# Patient Record
Sex: Female | Born: 1980
Health system: Southern US, Community
[De-identification: ages and names within clinical notes are randomized; demographics above are authoritative.]

## PROBLEM LIST (undated history)

## (undated) DIAGNOSIS — G600 Hereditary motor and sensory neuropathy: Secondary | ICD-10-CM

## (undated) DIAGNOSIS — G7100 Muscular dystrophy, unspecified: Secondary | ICD-10-CM

## (undated) DIAGNOSIS — G43909 Migraine, unspecified, not intractable, without status migrainosus: Secondary | ICD-10-CM

## (undated) HISTORY — DX: Muscular dystrophy, unspecified: G71.00

## (undated) HISTORY — PX: ABDOMINAL HYSTERECTOMY: SHX81

## (undated) HISTORY — PX: TUBAL LIGATION: SHX77

## (undated) HISTORY — DX: Migraine, unspecified, not intractable, without status migrainosus: G43.909

## (undated) HISTORY — DX: Hereditary motor and sensory neuropathy: G60.0

## (undated) HISTORY — PX: OTHER SURGICAL HISTORY: SHX169

---

## 2002-04-16 ENCOUNTER — Other Ambulatory Visit: Admission: RE | Admit: 2002-04-16 | Discharge: 2002-04-16 | Payer: Self-pay | Admitting: *Deleted

## 2003-04-03 ENCOUNTER — Other Ambulatory Visit: Admission: RE | Admit: 2003-04-03 | Discharge: 2003-04-03 | Payer: Self-pay | Admitting: Obstetrics and Gynecology

## 2007-04-04 ENCOUNTER — Emergency Department: Payer: Self-pay | Admitting: Emergency Medicine

## 2009-05-24 ENCOUNTER — Emergency Department: Payer: Self-pay | Admitting: Emergency Medicine

## 2009-05-27 ENCOUNTER — Ambulatory Visit: Payer: Self-pay

## 2011-08-03 ENCOUNTER — Ambulatory Visit: Payer: Self-pay | Admitting: Obstetrics & Gynecology

## 2011-08-03 LAB — CBC
MCH: 31.9 pg (ref 26.0–34.0)
MCV: 94 fL (ref 80–100)
Platelet: 257 10*3/uL (ref 150–440)
RDW: 12.1 % (ref 11.5–14.5)

## 2011-08-05 ENCOUNTER — Ambulatory Visit: Payer: Self-pay | Admitting: Obstetrics & Gynecology

## 2011-08-26 ENCOUNTER — Encounter: Payer: Self-pay | Admitting: Internal Medicine

## 2011-08-26 ENCOUNTER — Ambulatory Visit (INDEPENDENT_AMBULATORY_CARE_PROVIDER_SITE_OTHER): Payer: BC Managed Care – PPO | Admitting: Internal Medicine

## 2011-08-26 VITALS — BP 110/62 | HR 76 | Temp 97.8°F | Resp 14 | Ht 59.5 in | Wt 118.8 lb

## 2011-08-26 DIAGNOSIS — R5381 Other malaise: Secondary | ICD-10-CM

## 2011-08-26 DIAGNOSIS — R5383 Other fatigue: Secondary | ICD-10-CM

## 2011-08-26 DIAGNOSIS — G601 Refsum's disease: Secondary | ICD-10-CM

## 2011-08-26 DIAGNOSIS — F3281 Premenstrual dysphoric disorder: Secondary | ICD-10-CM

## 2011-08-26 DIAGNOSIS — K6289 Other specified diseases of anus and rectum: Secondary | ICD-10-CM

## 2011-08-26 DIAGNOSIS — G7100 Muscular dystrophy, unspecified: Secondary | ICD-10-CM

## 2011-08-26 DIAGNOSIS — G7109 Other specified muscular dystrophies: Secondary | ICD-10-CM

## 2011-08-26 DIAGNOSIS — G600 Hereditary motor and sensory neuropathy: Secondary | ICD-10-CM

## 2011-08-26 DIAGNOSIS — N943 Premenstrual tension syndrome: Secondary | ICD-10-CM

## 2011-08-26 MED ORDER — PREGABALIN 50 MG PO CAPS: 50.0000 mg | ORAL_CAPSULE | Freq: Three times a day (TID) | ORAL | Status: DC

## 2011-08-26 MED ORDER — SPIRONOLACTONE 25 MG PO TABS: 25.0000 mg | ORAL_TABLET | Freq: Every day | ORAL | Status: DC

## 2011-08-26 MED ORDER — NORETHINDRONE 0.35 MG PO TABS: 1.0000 | ORAL_TABLET | Freq: Every day | ORAL | Status: DC

## 2011-08-26 NOTE — Progress Notes (Signed)
Patient ID: Nichole Mcneil, female   DOB: 1981-01-30, 31 y.o.   MRN: [id number] Patient Active Problem List  Diagnosis  . Muscular dystrophy  . Charcot Marie Tooth muscular atrophy  . Premenstrual dysphoric syndrome  . Anal or rectal pain    Subjective:  CC:   Chief Complaint  Patient presents with  . New Patient    HPI:   Nichole Mcneil is a 31 y.o. female who presents as a new patient to establish primary care with several issues to discuss.  She has Charcot Marie tooth muscular dystrophy diagnosed at age 31 .  Trial of leg braces as a child not helpful bc they pinched her legs.  Use of orthotics has been problematic bc of extremely high arches. She has been followed by a MD specialist in [facility] for years with no change in therapy and no progression of disease.  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 Daytime neurontin dose was tried but was too sedating .  No prior trial of lyrica pain scale is 5/10 .  Exercises regularly to keep her strength and mobility up.  History of ovarian cyst which caused nausea and pain so it was removed on June 14, by Dr. Tiburcio Pea with good results and no complications thus far,  Her post op f/u at one week was done.  She has not returned to her pre suregery exercise routine .  She was participating in a spin class regularly, and lifting light weights .  She has had menstrual migraines for years. Takes Camila and has a menstrual cycles every 3 monts. Her premenstrual dysphoria includes Nausea and bloating with wt gain of 5  To 8 lbs in one week.  All in all she has 2 weeks of misery per menstrual cycle every 3 months.  She was given zofran and imitrex by GYN and naproxen. No prior trial of SSRI for premenstrual dysphoria.  No trial of diuretic despite the wt gain.   Past Medical History  Diagnosis Date  . Muscular dystrophy     at age 36   . Muscular dystrophy   . Charcot Marie Tooth muscular atrophy     History  reviewed. No pertinent past surgical history.  Family History  Problem Relation Age of Onset  . Muscular dystrophy Mother   . Hyperlipidemia Mother   . Endometriosis Mother   . Migraines Father   . Nephrolithiasis Father   . Hypertension Father   . Arthritis Brother   . Aneurysm Maternal Uncle   . Stroke Maternal Grandmother   . Aneurysm Maternal Grandfather   . Migraines Paternal Grandmother   . Cancer Paternal Grandfather     lung    History   Social History  . Marital Status: Single    Spouse Name: N/A    Number of Children: N/A  . Years of Education: N/A   Occupational History  . Not on file.   Social History Main Topics  . Smoking status: Never Smoker   . Smokeless tobacco: Never Used  . Alcohol Use: 1.2 oz/week    2 Glasses of wine per week  . Drug Use: No  . Sexually Active: Not on file   Other Topics Concern  . Not on file   Social History Narrative  . No narrative on file   Allergies  Allergen Reactions  . Erythromycin   . Metronidazole Other (See Comments)    May aggravate neuropathy  . Nitrofurantoin  May aggravate neuropathy  . Sulfa Antibiotics     Review of Systems:   The remainder of the review of systems was negative except those addressed in the HPI.       Objective:  BP 110/62  Pulse 76  Temp 97.8 F (36.6 C) (Oral)  Resp 14  Ht 4' 11.5" (1.511 m)  Wt 118 lb 12 oz (53.865 kg)  BMI 23.58 kg/m2  SpO2 99%  LMP 07/28/2011  General appearance: alert, cooperative and appears stated age Ears: normal TM's and external ear canals both ears Throat: lips, mucosa, and tongue normal; teeth and gums normal Neck: no adenopathy, no carotid bruit, supple, symmetrical, trachea midline and thyroid not enlarged, symmetric, no tenderness/mass/nodules Back: symmetric, no curvature. ROM normal. No CVA tenderness. Lungs: clear to auscultation bilaterally Heart: regular rate and rhythm, S1, S2 normal, no murmur, click, rub or  gallop Abdomen: soft, non-tender; bowel sounds normal; no masses,  no organomegaly Pulses: 2+ and symmetric Skin: Skin color, texture, turgor normal. No rashes or lesions Lymph nodes: Cervical, supraclavicular, and axillary nodes normal. MSK: stork leg calf structure ,   high plantar arches otherwise normal  Assessment and Plan:  Muscular dystrophy Non progressive per patient.  Follow up with neurolgy/orhtopedics prn  Charcot Hilda Lias Tooth muscular atrophy With neuropathy (hereditary sensory motor neuropathy) symptoms not adequately relieved with neurontin.  Trial of lyrica discussed and samples given.  Will need to avoid metronidazole and nitrofurantoin as these can aggravated Charcot Marie Tooth syndrome neuropathy.   Premenstrual dysphoric syndrome With bloating, irritability.  Spironolactone and pristiq  Discussed as adjunct therapy  Anal or rectal pain Periodic, may be aggravated by constipation.  Trial of daily fiber supplement.    Updated Medication List Outpatient Encounter Prescriptions as of 08/26/2011  Medication Sig Dispense Refill  . Chlorpheniramine Maleate (ALLERGY PO) Take by mouth.      . cholecalciferol (VITAMIN D) 1000 UNITS tablet Take 2,000 Units by mouth daily.      . Cranberry 400 MG CAPS Take by mouth.      . fish oil-omega-3 fatty acids 1000 MG capsule Take 2 g by mouth daily.      . Multiple Vitamin (MULTIVITAMIN) tablet Take 1 tablet by mouth daily.      . naproxen (NAPROSYN) 500 MG tablet Take 500 mg by mouth 2 (two) times daily with a meal.      . norethindrone (MICRONOR,CAMILA,ERRIN) 0.35 MG tablet Take 1 tablet (0.35 mg total) by mouth daily.  3 Package  3  . ondansetron (ZOFRAN) 4 MG tablet Take 4 mg by mouth every 8 (eight) hours as needed.      . pregabalin (LYRICA) 50 MG capsule Take 1 capsule (50 mg total) by mouth 3 (three) times daily.  90 capsule  0  . Riboflavin (VITAMIN B-2 PO) Take by mouth.      . spironolactone (ALDACTONE) 25 MG tablet Take 1  tablet (25 mg total) by mouth daily.  30 tablet  2  . SUMAtriptan (IMITREX) 100 MG tablet Take 100 mg by mouth every 2 (two) hours as needed.      Marland Kitchen DISCONTD: gabapentin (NEURONTIN) 300 MG capsule Take 300 mg by mouth at bedtime.      Marland Kitchen DISCONTD: norethindrone (MICRONOR,CAMILA,ERRIN) 0.35 MG tablet Take 1 tablet by mouth daily.         Orders Placed This Encounter  Procedures  . TSH  . COMPLETE METABOLIC PANEL WITH GFR    Return in about 1 month (  around 09/26/2011).

## 2011-08-26 NOTE — Patient Instructions (Addendum)
Trial of Lyrica  50 mg up to 3 times daily ( start with one at bedtime,,  Add 2nd one  in the afternoon after a few days if tolerated,  Etc)  Sending diuretic to pharmacy spironolactone 25 mg  Daily in the morning for fluid retention as needed    Do not start the pristiq until we talk or meet again  Take a fiber supplement daily for  Few weeks to see if this manages the rectal pain

## 2011-08-28 ENCOUNTER — Encounter: Payer: Self-pay | Admitting: Internal Medicine

## 2011-08-28 DIAGNOSIS — K6289 Other specified diseases of anus and rectum: Secondary | ICD-10-CM | POA: Insufficient documentation

## 2011-08-28 DIAGNOSIS — F3281 Premenstrual dysphoric disorder: Secondary | ICD-10-CM | POA: Insufficient documentation

## 2011-08-28 DIAGNOSIS — G601 Refsum's disease: Secondary | ICD-10-CM | POA: Insufficient documentation

## 2011-08-28 DIAGNOSIS — G600 Hereditary motor and sensory neuropathy: Secondary | ICD-10-CM | POA: Insufficient documentation

## 2011-08-28 NOTE — Assessment & Plan Note (Signed)
Non progressive per patient.  Follow up with neurolgy/orhtopedics prn

## 2011-08-28 NOTE — Assessment & Plan Note (Signed)
With neuropathy (hereditary sensory motor neuropathy) symptoms not adequately relieved with neurontin.  Trial of lyrica discussed and samples given.  Will need to avoid metronidazole and nitrofurantoin as these can aggravated Charcot Marie Tooth syndrome neuropathy.

## 2011-08-28 NOTE — Assessment & Plan Note (Signed)
Periodic, may be aggravated by constipation.  Trial of daily fiber supplement.

## 2011-08-28 NOTE — Assessment & Plan Note (Signed)
With bloating, irritability.  Spironolactone and pristiq  Discussed as adjunct therapy

## 2011-08-29 ENCOUNTER — Other Ambulatory Visit: Payer: BC Managed Care – PPO

## 2011-09-02 ENCOUNTER — Other Ambulatory Visit: Payer: BC Managed Care – PPO

## 2011-09-06 ENCOUNTER — Telehealth: Payer: Self-pay | Admitting: *Deleted

## 2011-09-06 NOTE — Telephone Encounter (Signed)
Pt states that since starting the Lyrica on 7/05 she has increased joint pain and HA's. Pt is currently taking the 50mg  qd and has not increased the dosage. Pt wants to know if the increased joint pain and HA are side effects associated with Lyrica or should she continue to take and increased dosage as discussed in OV.

## 2011-09-06 NOTE — Telephone Encounter (Signed)
I have not had these side effects reported before,. but if it is the only new mediation she has tried, it is possible.  the only way to know would be to stop it for one week and then restart it to see if the symptoms abate and then return

## 2011-09-07 NOTE — Telephone Encounter (Signed)
Patient notified

## 2011-09-30 ENCOUNTER — Ambulatory Visit: Payer: BC Managed Care – PPO | Admitting: Internal Medicine

## 2011-09-30 ENCOUNTER — Telehealth: Payer: Self-pay | Admitting: Internal Medicine

## 2011-09-30 NOTE — Telephone Encounter (Signed)
She can reserve the spironolactone for the days leading up to her period when she feels the bloating and weight gain . Does not have to take it daily.

## 2011-09-30 NOTE — Telephone Encounter (Signed)
Patient called and stated she could not take the Lyrica you prescribed and she is now off of gabapentin and she said she is doing fine.  She stated the spironolactone you prescribed has worked great but she wanted to know if you wanted her to stay on it everyday or just when she is on her period.  Please advise.

## 2011-09-30 NOTE — Telephone Encounter (Signed)
Left detailed message notifying patient.

## 2012-01-23 ENCOUNTER — Other Ambulatory Visit: Payer: Self-pay | Admitting: Internal Medicine

## 2012-01-23 MED ORDER — SUMATRIPTAN SUCCINATE 100 MG PO TABS: 100.0000 mg | ORAL_TABLET | ORAL | Status: DC | PRN

## 2012-01-23 NOTE — Telephone Encounter (Signed)
Refill request for Imitrex 100 mg. Ok to refill? 

## 2012-01-23 NOTE — Telephone Encounter (Signed)
Pt is needing refill on Amatrx ??? Pt uses [facility] at [facility]

## 2012-11-15 ENCOUNTER — Encounter: Payer: Self-pay | Admitting: Obstetrics and Gynecology

## 2012-11-21 ENCOUNTER — Encounter: Payer: Self-pay | Admitting: Obstetrics and Gynecology

## 2013-02-06 ENCOUNTER — Observation Stay: Payer: Self-pay

## 2013-02-13 ENCOUNTER — Inpatient Hospital Stay: Payer: Self-pay | Admitting: Obstetrics & Gynecology

## 2013-02-13 LAB — CBC WITH DIFFERENTIAL/PLATELET
Basophil %: 0.5 %
Eosinophil #: 1 10*3/uL — ABNORMAL HIGH (ref 0.0–0.7)
Eosinophil %: 4.8 %
HCT: 35.3 % (ref 35.0–47.0)
HGB: 11.9 g/dL — ABNORMAL LOW (ref 12.0–16.0)
Lymphocyte #: 4.3 10*3/uL — ABNORMAL HIGH (ref 1.0–3.6)
MCHC: 33.7 g/dL (ref 32.0–36.0)
Monocyte #: 1.3 x10 3/mm — ABNORMAL HIGH (ref 0.2–0.9)
Monocyte %: 6.1 %
Neutrophil %: 68.8 %
RBC: 3.83 10*6/uL (ref 3.80–5.20)
RDW: 12.6 % (ref 11.5–14.5)
WBC: 21.8 10*3/uL — ABNORMAL HIGH (ref 3.6–11.0)

## 2013-02-13 LAB — WBC: WBC: 23.9 10*3/uL — ABNORMAL HIGH (ref 3.6–11.0)

## 2013-05-30 ENCOUNTER — Ambulatory Visit: Payer: BC Managed Care – PPO | Admitting: Internal Medicine

## 2013-06-13 ENCOUNTER — Ambulatory Visit: Payer: BC Managed Care – PPO | Admitting: Internal Medicine

## 2013-07-18 ENCOUNTER — Telehealth: Payer: Self-pay | Admitting: Internal Medicine

## 2013-07-18 NOTE — Telephone Encounter (Signed)
Pt would like to know if you will accept her as a new pt.  Pt is the daughter of Juliann Pulse and ron goodwin.

## 2013-07-18 NOTE — Telephone Encounter (Signed)
Sorry but I am too full  

## 2013-07-30 NOTE — Telephone Encounter (Signed)
Another note sent under mom's name and dr agreed to see. appt sch

## 2013-09-04 ENCOUNTER — Encounter: Payer: Self-pay | Admitting: Internal Medicine

## 2013-09-04 ENCOUNTER — Ambulatory Visit: Payer: BC Managed Care – PPO | Admitting: Family Medicine

## 2013-09-04 ENCOUNTER — Ambulatory Visit (INDEPENDENT_AMBULATORY_CARE_PROVIDER_SITE_OTHER): Payer: BC Managed Care – PPO | Admitting: Internal Medicine

## 2013-09-04 VITALS — BP 122/84 | HR 78 | Temp 98.2°F | Wt 147.0 lb

## 2013-09-04 DIAGNOSIS — J069 Acute upper respiratory infection, unspecified: Secondary | ICD-10-CM

## 2013-09-04 DIAGNOSIS — B379 Candidiasis, unspecified: Secondary | ICD-10-CM

## 2013-09-04 DIAGNOSIS — T3695XA Adverse effect of unspecified systemic antibiotic, initial encounter: Secondary | ICD-10-CM

## 2013-09-04 MED ORDER — AMOXICILLIN 500 MG PO CAPS: 500.0000 mg | ORAL_CAPSULE | Freq: Three times a day (TID) | ORAL | Status: DC

## 2013-09-04 MED ORDER — FLUCONAZOLE 150 MG PO TABS: 150.0000 mg | ORAL_TABLET | Freq: Once | ORAL | Status: DC

## 2013-09-04 MED ORDER — HYDROCODONE-HOMATROPINE 5-1.5 MG/5ML PO SYRP: 5.0000 mL | ORAL_SOLUTION | Freq: Three times a day (TID) | ORAL | Status: DC | PRN

## 2013-09-04 NOTE — Progress Notes (Signed)
Pre visit review using our clinic review tool, if applicable. No additional management support is needed unless otherwise documented below in the visit note. 

## 2013-09-04 NOTE — Progress Notes (Signed)
HPI  Pt presents to the clinic today with c/o cough, nasal congestion, sore throat and shortness of breath. She reports this started 2 weeks ago. The cough is non productive. She denies fever, chills or body aches. She has tried Sudafed, Delsym, Zicam and Vit C without relief. She has no history of seasonal allergies or asthma that she is aware of. She has not had sick contacts.  Review of Systems      Past Medical History  Diagnosis Date  . Muscular dystrophy     at age 33   . Muscular dystrophy   . Charcot Marie Tooth muscular atrophy     Family History  Problem Relation Age of Onset  . Muscular dystrophy Mother   . Hyperlipidemia Mother   . Endometriosis Mother   . Migraines Father   . Nephrolithiasis Father   . Hypertension Father   . Arthritis Brother   . Aneurysm Maternal Uncle   . Stroke Maternal Grandmother   . Aneurysm Maternal Grandfather   . Migraines Paternal Grandmother   . Cancer Paternal Grandfather     lung    History   Social History  . Marital Status: Single    Spouse Name: N/A    Number of Children: N/A  . Years of Education: N/A   Occupational History  . Not on file.   Social History Main Topics  . Smoking status: Never Smoker   . Smokeless tobacco: Never Used  . Alcohol Use: No  . Drug Use: No  . Sexual Activity: Not on file   Other Topics Concern  . Not on file   Social History Narrative  . No narrative on file    Allergies  Allergen Reactions  . Erythromycin   . Metronidazole Other (See Comments)    May aggravate neuropathy  . Nitrofurantoin     May aggravate neuropathy  . Sulfa Antibiotics      Constitutional: Positive headache. Denies fatigue, fever or abrupt weight changes.  HEENT:  Positive sore throat. Denies eye redness, eye pain, pressure behind the eyes, facial pain, nasal congestion, ear pain, ringing in the ears, wax buildup, runny nose or bloody nose. Respiratory: Positive cough. Denies difficulty breathing or  shortness of breath.  Cardiovascular: Denies chest pain, chest tightness, palpitations or swelling in the hands or feet.   No other specific complaints in a complete review of systems (except as listed in HPI above).  Objective:   BP 122/84  Pulse 78  Temp(Src) 98.2 F (36.8 C) (Oral)  Wt 147 lb (66.679 kg)  SpO2 98% Wt Readings from Last 3 Encounters:  09/04/13 147 lb (66.679 kg)  08/26/11 118 lb 12 oz (53.865 kg)     General: Appears her stated age, well developed, well nourished in NAD. HEENT: Head: normal shape and size; Eyes: sclera white, no icterus, conjunctiva pink, PERRLA and EOMs intact; Ears: Tm's gray and intact, normal light reflex; Nose: mucosa pink and moist, septum midline; Throat/Mouth: + PND. Teeth present, mucosa erythematous and moist, no exudate noted, no lesions or ulcerations noted.   Cardiovascular: Normal rate and rhythm. S1,S2 noted.  No murmur, rubs or gallops noted. No JVD or BLE edema. No carotid bruits noted. Pulmonary/Chest: Normal effort and positive vesicular breath sounds. No respiratory distress. No wheezes, rales or ronchi noted.      Assessment & Plan:   Upper Respiratory Infection:  Get some rest and drink plenty of water Do salt water gargles for the sore throat eRx for Azithromax  x 5 days eRx for Hycodan cough syrup eRx for Diflucan for antibiotic induced yeast infection  RTC as needed or if symptoms persist.

## 2013-09-04 NOTE — Patient Instructions (Addendum)
Upper Respiratory Infection, Adult An upper respiratory infection (URI) is also sometimes known as the common cold. The upper respiratory tract includes the nose, sinuses, throat, trachea, and bronchi. Bronchi are the airways leading to the lungs. Most people improve within 1 week, but symptoms can last up to 2 weeks. A residual cough may last even longer.  CAUSES Many different viruses can infect the tissues lining the upper respiratory tract. The tissues become irritated and inflamed and often become very moist. Mucus production is also common. A cold is contagious. You can easily spread the virus to others by oral contact. This includes kissing, sharing a glass, coughing, or sneezing. Touching your mouth or nose and then touching a surface, which is then touched by another person, can also spread the virus. SYMPTOMS  Symptoms typically develop 1 to 3 days after you come in contact with a cold virus. Symptoms vary from person to person. They may include:  Runny nose.  Sneezing.  Nasal congestion.  Sinus irritation.  Sore throat.  Loss of voice (laryngitis).  Cough.  Fatigue.  Muscle aches.  Loss of appetite.  Headache.  Low-grade fever. DIAGNOSIS  You might diagnose your own cold based on familiar symptoms, since most people get a cold 2 to 3 times a year. Your caregiver can confirm this based on your exam. Most importantly, your caregiver can check that your symptoms are not due to another disease such as strep throat, sinusitis, pneumonia, asthma, or epiglottitis. Blood tests, throat tests, and X-rays are not necessary to diagnose a common cold, but they may sometimes be helpful in excluding other more serious diseases. Your caregiver will decide if any further tests are required. RISKS AND COMPLICATIONS  You may be at risk for a more severe case of the common cold if you smoke cigarettes, have chronic heart disease (such as heart failure) or lung disease (such as asthma), or if  you have a weakened immune system. The very young and very old are also at risk for more serious infections. Bacterial sinusitis, middle ear infections, and bacterial pneumonia can complicate the common cold. The common cold can worsen asthma and chronic obstructive pulmonary disease (COPD). Sometimes, these complications can require emergency medical care and may be life-threatening. PREVENTION  The best way to protect against getting a cold is to practice good hygiene. Avoid oral or hand contact with people with cold symptoms. Wash your hands often if contact occurs. There is no clear evidence that vitamin C, vitamin E, echinacea, or exercise reduces the chance of developing a cold. However, it is always recommended to get plenty of rest and practice good nutrition. TREATMENT  Treatment is directed at relieving symptoms. There is no cure. Antibiotics are not effective, because the infection is caused by a virus, not by bacteria. Treatment may include:  Increased fluid intake. Sports drinks offer valuable electrolytes, sugars, and fluids.  Breathing heated mist or steam (vaporizer or shower).  Eating chicken soup or other clear broths, and maintaining good nutrition.  Getting plenty of rest.  Using gargles or lozenges for comfort.  Controlling fevers with ibuprofen or acetaminophen as directed by your caregiver.  Increasing usage of your inhaler if you have asthma. Zinc gel and zinc lozenges, taken in the first 24 hours of the common cold, can shorten the duration and lessen the severity of symptoms. Pain medicines may help with fever, muscle aches, and throat pain. A variety of non-prescription medicines are available to treat congestion and runny nose. Your caregiver   can make recommendations and may suggest nasal or lung inhalers for other symptoms.  HOME CARE INSTRUCTIONS   Only take over-the-counter or prescription medicines for pain, discomfort, or fever as directed by your  caregiver.  Use a warm mist humidifier or inhale steam from a shower to increase air moisture. This may keep secretions moist and make it easier to breathe.  Drink enough water and fluids to keep your urine clear or pale yellow.  Rest as needed.  Return to work when your temperature has returned to normal or as your caregiver advises. You may need to stay home longer to avoid infecting others. You can also use a face mask and careful hand washing to prevent spread of the virus. SEEK MEDICAL CARE IF:   After the first few days, you feel you are getting worse rather than better.  You need your caregiver's advice about medicines to control symptoms.  You develop chills, worsening shortness of breath, or brown or red sputum. These may be signs of pneumonia.  You develop yellow or brown nasal discharge or pain in the face, especially when you bend forward. These may be signs of sinusitis.  You develop a fever, swollen neck glands, pain with swallowing, or white areas in the back of your throat. These may be signs of strep throat. SEEK IMMEDIATE MEDICAL CARE IF:   You have a fever.  You develop severe or persistent headache, ear pain, sinus pain, or chest pain.  You develop wheezing, a prolonged cough, cough up blood, or have a change in your usual mucus (if you have chronic lung disease).  You develop sore muscles or a stiff neck. Document Released: 08/03/2000 Document Revised: 05/02/2011 Document Reviewed: 06/11/2010 ExitCare Patient Information 2015 [employer]. This information is not intended to replace advice given to you by your health care provider. Make sure you discuss any questions you have with your health care provider.  

## 2013-10-08 DIAGNOSIS — Q667 Congenital pes cavus, unspecified foot: Secondary | ICD-10-CM | POA: Insufficient documentation

## 2013-10-08 DIAGNOSIS — M79672 Pain in left foot: Secondary | ICD-10-CM | POA: Insufficient documentation

## 2013-11-01 ENCOUNTER — Ambulatory Visit: Payer: BC Managed Care – PPO | Admitting: Internal Medicine

## 2013-11-01 DIAGNOSIS — Z0289 Encounter for other administrative examinations: Secondary | ICD-10-CM

## 2013-11-04 ENCOUNTER — Encounter: Payer: Self-pay | Admitting: Family Medicine

## 2013-11-04 ENCOUNTER — Ambulatory Visit (INDEPENDENT_AMBULATORY_CARE_PROVIDER_SITE_OTHER): Payer: BC Managed Care – PPO | Admitting: Family Medicine

## 2013-11-04 VITALS — BP 122/74 | HR 59 | Temp 98.6°F | Ht 59.0 in | Wt 158.0 lb

## 2013-11-04 DIAGNOSIS — M109 Gout, unspecified: Secondary | ICD-10-CM | POA: Insufficient documentation

## 2013-11-04 DIAGNOSIS — G600 Hereditary motor and sensory neuropathy: Secondary | ICD-10-CM

## 2013-11-04 DIAGNOSIS — G43909 Migraine, unspecified, not intractable, without status migrainosus: Secondary | ICD-10-CM | POA: Insufficient documentation

## 2013-11-04 DIAGNOSIS — M10071 Idiopathic gout, right ankle and foot: Secondary | ICD-10-CM

## 2013-11-04 DIAGNOSIS — M1009 Idiopathic gout, multiple sites: Secondary | ICD-10-CM

## 2013-11-04 DIAGNOSIS — G43809 Other migraine, not intractable, without status migrainosus: Secondary | ICD-10-CM

## 2013-11-04 DIAGNOSIS — G7109 Other specified muscular dystrophies: Secondary | ICD-10-CM

## 2013-11-04 DIAGNOSIS — G7100 Muscular dystrophy, unspecified: Secondary | ICD-10-CM

## 2013-11-04 LAB — CBC WITH DIFFERENTIAL/PLATELET
Basophils Absolute: 0 10*3/uL (ref 0.0–0.1)
Basophils Relative: 0.2 % (ref 0.0–3.0)
EOS PCT: 0 % (ref 0.0–5.0)
Eosinophils Absolute: 0 10*3/uL (ref 0.0–0.7)
HCT: 42.3 % (ref 36.0–46.0)
Hemoglobin: 14.1 g/dL (ref 12.0–15.0)
Lymphocytes Relative: 16.6 % (ref 12.0–46.0)
Lymphs Abs: 2.7 10*3/uL (ref 0.7–4.0)
MCHC: 33.5 g/dL (ref 30.0–36.0)
MCV: 97.4 fl (ref 78.0–100.0)
MONO ABS: 0.8 10*3/uL (ref 0.1–1.0)
Monocytes Relative: 4.7 % (ref 3.0–12.0)
NEUTROS PCT: 78.5 % — AB (ref 43.0–77.0)
Neutro Abs: 12.6 10*3/uL — ABNORMAL HIGH (ref 1.4–7.7)
PLATELETS: 296 10*3/uL (ref 150.0–400.0)
RBC: 4.34 Mil/uL (ref 3.87–5.11)
RDW: 13 % (ref 11.5–15.5)
WBC: 16.1 10*3/uL — AB (ref 4.0–10.5)

## 2013-11-04 LAB — BASIC METABOLIC PANEL
BUN: 21 mg/dL (ref 6–23)
CHLORIDE: 103 meq/L (ref 96–112)
CO2: 25 meq/L (ref 19–32)
CREATININE: 0.7 mg/dL (ref 0.4–1.2)
Calcium: 9.5 mg/dL (ref 8.4–10.5)
GFR: 109.09 mL/min (ref >60.00)
Glucose, Bld: 90 mg/dL (ref 70–99)
POTASSIUM: 4.4 meq/L (ref 3.5–5.1)
Sodium: 137 mEq/L (ref 135–145)

## 2013-11-04 LAB — HEPATIC FUNCTION PANEL
ALT: 16 U/L (ref 0–35)
AST: 17 U/L (ref 0–37)
Albumin: 4 g/dL (ref 3.5–5.2)
Alkaline Phosphatase: 54 U/L (ref 39–117)
BILIRUBIN DIRECT: 0 mg/dL (ref 0.0–0.3)
BILIRUBIN TOTAL: 0.4 mg/dL (ref 0.2–1.2)
TOTAL PROTEIN: 7.4 g/dL (ref 6.0–8.3)

## 2013-11-04 LAB — TSH: TSH: 0.39 u[IU]/mL (ref 0.35–4.50)

## 2013-11-04 LAB — VITAMIN B12: VITAMIN B 12: 364 pg/mL (ref 211–911)

## 2013-11-04 LAB — URIC ACID: Uric Acid, Serum: 2.5 mg/dL (ref 2.4–7.0)

## 2013-11-04 NOTE — Progress Notes (Signed)
   Subjective:    Patient ID: Nichole Mcneil, female    DOB: October 07, 1980, 33 y.o.   MRN: [id number]  HPI 33 yr old female to establish with [place] after transfering from Dr. Derrel Nip. She is doing well in general although she is recovering from an apparent gout episode. She has never had this before but her father has a hx of gout. Last week she had the sudden onset of swelling and redness and pain in the right forefoot with no hx of trauma. She went to Urgent Care and was told she had gout, although no lab work was done. She was treated with Indocin and colchicine at first with no response, then she was treated with prednisone. This was more effective and now her foot is almost back to normal. She has migraines about once or twice a year. These are much better now than when she was younger. She has Charcot Lelan Pons Tooth muscular dystrophy, and this has been stable for several years. She was diagnosed at age 25, and she struggled with weakness for years. Now however she feels much better and has only some mild tingling in the feet. She had seen Dr. Dahlia Bailiff at the [facility] office, having last seen him in February of this year. She has decided she does not want to go back to this office, so I asked her about a referral to a new neurologist.    Review of Systems  Respiratory: Negative.   Cardiovascular: Negative.   Musculoskeletal: Positive for arthralgias and joint swelling.  Neurological: Positive for weakness and numbness. Negative for dizziness, tremors, seizures, syncope, facial asymmetry, speech difficulty, light-headedness and headaches.       Objective:   Physical Exam  Constitutional: She is oriented to person, place, and time. She appears well-developed and well-nourished.  Neck: No thyromegaly present.  Cardiovascular: Normal rate, regular rhythm, normal heart sounds and intact distal pulses.   Pulmonary/Chest: Effort normal and breath sounds normal.  Musculoskeletal:    She has 2+ edema in both feet. No erythema or warmth. She is tender over the MTP joints of the right foot  Lymphadenopathy:    She has no cervical adenopathy.  Neurological: She is alert and oriented to person, place, and time. She has normal reflexes. No cranial nerve deficit. She exhibits normal muscle tone. Coordination normal.          Assessment & Plan:  Recent diagnosis of gout, we will get labs today including a uric acid level. Her muscular dystrophy seems to be stable at this point so I agreed that she will monitor this on her own. If her symptoms worsen in any way, we will refer her back to Neurology.

## 2013-11-04 NOTE — Progress Notes (Signed)
Pre visit review using our clinic review tool, if applicable. No additional management support is needed unless otherwise documented below in the visit note. 

## 2013-11-06 ENCOUNTER — Ambulatory Visit: Payer: Self-pay | Admitting: Obstetrics & Gynecology

## 2013-11-08 ENCOUNTER — Telehealth: Payer: Self-pay | Admitting: Family Medicine

## 2013-11-08 MED ORDER — FUROSEMIDE 20 MG PO TABS: 20.0000 mg | ORAL_TABLET | Freq: Every day | ORAL | Status: DC

## 2013-11-08 NOTE — Telephone Encounter (Signed)
Pt spoke w/ sylvia on wed about labs results and pt voiced concerns about poss gout in foot. Pt waiting on cb about her elevated white blood cell count.  Pt also her birth controls  coil came out wed am. Pt spoke w/ a nurse concerning this.  However. Pt's foot is still hurting from what they thin is a gout attack and she thinks it is turning blue. Transferred pt to triage for this issue,

## 2013-11-08 NOTE — Telephone Encounter (Signed)
Patient Information:  Caller Name: [place]  Phone: [phone]  Patient: Nichole Mcneil, Nichole Mcneil  Gender: Female  DOB: Nov 07, 1980  Age: 33 Years  PCP: Alysia [facility])  Pregnant: No  Office Follow Up:  Does the office need to follow up with this patient?: No  Instructions For The Office: N/A  RN Note:  Pt prefers to come to the office over the ED; office called and made aware; Dr Sarajane Jews instructed for pt to go to ED due to further testing capabilities; MD wanted pt aware that Lasix was sent to pharmacy if needed in the future. Pt aware and very upset that MD is sending her to ED; RN attempted to explain the reason behind the ED; pt still feels that this is a "cop-out" for the MD and that he should want to see her.  Offered to try and make an appt with the intention that she may need to go to ED; pt refused and stated that she needs to process this and not sure she wants a doctor like this caring for her.  Instructed pt to please get medical attention for this today via the ED or please call us back for an appt; pt thanked [place] and hung up the phone.   Office called and made aware.  Symptoms  Reason For Call & Symptoms: Pt is calling and states that she was seen in the office on 11/04/13 and noticed that WBC were elevated;  on 11/06/13 she lost a coil from her fallopian tube from the Esure Procedure;  was seen by her OBGYN and CT scan was done and no internal damage was noted per pt;  today, 11/08/13 the toes on her right foot are blue; no numbness noted; rates pain to the toes 6-7/10; pt also wants to discuss the elevated WBC with MD  Reviewed Health History In EMR: Yes  Reviewed Medications In EMR: Yes  Reviewed Allergies In EMR: Yes  Reviewed Surgeries / Procedures: Yes  Date of Onset of Symptoms: 11/08/2013 OB / GYN:  LMP: 10/08/2013  Guideline(s) Used:  Toe Pain  Disposition Per Guideline:   Go to ED Now  Reason For Disposition Reached:   Foot is cool or blue in comparison to  other foot  Advice Given:  Call Back If:  You become worse.  Patient Refused Recommendation:  Patient Refused Care Advice  Pt very upset with ED recommendation and refusing further assitance offered at this time.

## 2013-11-08 NOTE — Telephone Encounter (Signed)
Per Dr. Sarajane Jews, pt should go to the ER. Estill Bamberg is on the phone with call a nurse and she is going to relay this information.

## 2013-11-08 NOTE — Telephone Encounter (Signed)
Dr. Fry is aware.  

## 2013-11-21 ENCOUNTER — Ambulatory Visit: Payer: Self-pay | Admitting: Obstetrics & Gynecology

## 2013-12-17 ENCOUNTER — Ambulatory Visit: Payer: Self-pay | Admitting: Obstetrics & Gynecology

## 2013-12-17 LAB — CBC
HCT: 47.1 % — ABNORMAL HIGH (ref 35.0–47.0)
HGB: 15.4 g/dL (ref 12.0–16.0)
MCH: 31.9 pg (ref 26.0–34.0)
MCHC: 32.7 g/dL (ref 32.0–36.0)
MCV: 98 fL (ref 80–100)
PLATELETS: 297 10*3/uL (ref 150–440)
RBC: 4.83 10*6/uL (ref 3.80–5.20)
RDW: 12.5 % (ref 11.5–14.5)
WBC: 15.3 10*3/uL — ABNORMAL HIGH (ref 3.6–11.0)

## 2013-12-20 ENCOUNTER — Ambulatory Visit: Payer: Self-pay | Admitting: Unknown Physician Specialty

## 2013-12-20 DIAGNOSIS — G710 Muscular dystrophy: Secondary | ICD-10-CM

## 2013-12-27 ENCOUNTER — Ambulatory Visit: Payer: Self-pay | Admitting: Obstetrics & Gynecology

## 2014-02-03 ENCOUNTER — Telehealth: Payer: Self-pay | Admitting: Family Medicine

## 2014-02-03 ENCOUNTER — Ambulatory Visit (INDEPENDENT_AMBULATORY_CARE_PROVIDER_SITE_OTHER): Payer: BC Managed Care – PPO | Admitting: Family Medicine

## 2014-02-03 ENCOUNTER — Encounter: Payer: Self-pay | Admitting: Family Medicine

## 2014-02-03 VITALS — BP 102/73 | HR 84 | Temp 97.6°F | Ht 59.0 in | Wt 154.0 lb

## 2014-02-03 DIAGNOSIS — J0100 Acute maxillary sinusitis, unspecified: Secondary | ICD-10-CM

## 2014-02-03 MED ORDER — HYDROCODONE-HOMATROPINE 5-1.5 MG/5ML PO SYRP: 5.0000 mL | ORAL_SOLUTION | ORAL | Status: DC | PRN

## 2014-02-03 MED ORDER — METHYLPREDNISOLONE 4 MG PO KIT: PACK | ORAL | Status: DC

## 2014-02-03 MED ORDER — AMOXICILLIN-POT CLAVULANATE 875-125 MG PO TABS: 1.0000 | ORAL_TABLET | Freq: Two times a day (BID) | ORAL | Status: DC

## 2014-02-03 NOTE — Telephone Encounter (Signed)
Pt called to ask if Dr Sarajane Jews will call her in some prdnisone to open up her head.[facility].   [place]

## 2014-02-03 NOTE — Telephone Encounter (Signed)
Per Dr. Sarajane Jews, order a Medrol Dose pack. I did send script e-scribe and spoke with pt.

## 2014-02-03 NOTE — Progress Notes (Signed)
   Subjective:    Patient ID: Nichole Mcneil, female    DOB: [birth year], 33 y.o.   MRN: [id number]  HPI Here for 3 weeks of intermittent sinus pressure, PND, and coughing up yellow sputum. On Tylenol Cold and Sinus.    Review of Systems  Constitutional: Negative.   HENT: Positive for congestion, postnasal drip and sinus pressure.   Eyes: Negative.   Respiratory: Positive for cough.        Objective:   Physical Exam  Constitutional: She appears well-developed and well-nourished.  HENT:  Right Ear: External ear normal.  Left Ear: External ear normal.  Nose: Nose normal.  Mouth/Throat: Oropharynx is clear and moist.  Eyes: Conjunctivae are normal.  Pulmonary/Chest: Effort normal and breath sounds normal. No respiratory distress. She has no wheezes. She has no rales.  Lymphadenopathy:    She has no cervical adenopathy.          Assessment & Plan:  Written out of work for today.

## 2014-02-03 NOTE — Progress Notes (Signed)
Pre visit review using our clinic review tool, if applicable. No additional management support is needed unless otherwise documented below in the visit note. 

## 2014-03-07 ENCOUNTER — Ambulatory Visit: Payer: Self-pay | Admitting: Family Medicine

## 2014-04-11 ENCOUNTER — Telehealth: Payer: Self-pay | Admitting: Family Medicine

## 2014-04-11 NOTE — Telephone Encounter (Signed)
Pt would like a new rx imitrex 100 mg call into [facility] [address] in [place]. Pt also would like to know what she caln try OTC for allergies.

## 2014-04-14 MED ORDER — SUMATRIPTAN SUCCINATE 100 MG PO TABS: 100.0000 mg | ORAL_TABLET | ORAL | Status: DC | PRN

## 2014-04-14 NOTE — Telephone Encounter (Signed)
I sent script e-scribe and spoke with pt. 

## 2014-04-28 ENCOUNTER — Encounter: Payer: Self-pay | Admitting: Family Medicine

## 2014-04-28 ENCOUNTER — Ambulatory Visit (INDEPENDENT_AMBULATORY_CARE_PROVIDER_SITE_OTHER): Payer: BLUE CROSS/BLUE SHIELD | Admitting: Family Medicine

## 2014-04-28 VITALS — BP 119/77 | HR 86 | Temp 98.8°F | Ht 59.0 in | Wt 151.0 lb

## 2014-04-28 DIAGNOSIS — J0100 Acute maxillary sinusitis, unspecified: Secondary | ICD-10-CM

## 2014-04-28 MED ORDER — AMOXICILLIN-POT CLAVULANATE 875-125 MG PO TABS: 1.0000 | ORAL_TABLET | Freq: Two times a day (BID) | ORAL | Status: DC

## 2014-04-28 NOTE — Progress Notes (Signed)
   Subjective:    Patient ID: Nichole Mcneil, female    DOB: April 21, 1980, 34 y.o.   MRN: [id number]  HPI Here for one week of sinus pressure, pain in both ears, PND and a ST. No cough.    Review of Systems  Constitutional: Negative.   HENT: Positive for congestion, postnasal drip and sinus pressure.   Eyes: Negative.   Respiratory: Negative.        Objective:   Physical Exam  Constitutional: She appears well-developed and well-nourished.  HENT:  Right Ear: External ear normal.  Left Ear: External ear normal.  Nose: Nose normal.  Mouth/Throat: Oropharynx is clear and moist.  Eyes: Conjunctivae are normal.  Pulmonary/Chest: Effort normal and breath sounds normal.  Lymphadenopathy:    She has no cervical adenopathy.          Assessment & Plan:  Add Mucinex D prn

## 2014-04-28 NOTE — Progress Notes (Signed)
Pre visit review using our clinic review tool, if applicable. No additional management support is needed unless otherwise documented below in the visit note. 

## 2014-05-27 ENCOUNTER — Telehealth: Payer: Self-pay | Admitting: Family Medicine

## 2014-05-27 MED ORDER — HYDROCOD POLST-CHLORPHEN POLST 10-8 MG/5ML PO LQCR: 5.0000 mL | Freq: Two times a day (BID) | ORAL | Status: DC | PRN

## 2014-05-27 NOTE — Telephone Encounter (Signed)
Pt  has cough and would like tussinex call into [facility] [address]

## 2014-05-27 NOTE — Telephone Encounter (Signed)
done

## 2014-05-27 NOTE — Telephone Encounter (Signed)
Script is ready for pick up and I spoke with pt. She is upset that we did not get back to her sooner. We just finished up with the last pt and the call was answered. I did apologize, however we just finished up.

## 2014-06-13 NOTE — Consult Note (Signed)
Referral Information:  Reason for Referral 34yo G1 at [redacted]w[redacted]d [facility] 02/26/2013 based on [redacted]w[redacted]d ultrasound performed on 07/05/2012 at [place]) with a diagnosis of Charcot-Marie-Tooth syndrome who has had an exacerbation of foot pain and significant lower extremity edema in the past 3-4 months, presents for recommendations.   Referring Physician [facility]   Prenatal Hx About 3-4 months ago began having significant lower extremity edema, to her knees, bilaterally and pain on the tops of her feet and outside of her ankles.  She had similar pain almost a year ago and saw orthopedics who tried various methods including casting and a boot to help with pain.  She is followed by a neurologist, Dr. Adria Devon, at [facility] but hasn't seen him since almost a year ago.  She has tried both physical and occupational therapy and gabapentin, the latter of which helped with pain but made her sleepy.  Just prior to pregnancy she would use a combination of tylenol and NSAIDs.  She hasn't tried anything during the pregnancy other than a steroid taper about a month ago which helped with the pain, but only temporarily.   Past Obstetrical Hx First pregnancy   Home Medications:  Medication Instructions Status  multivitamin, prenatal 1 once orally once a day Active  lysine 500 mg oral tablet  orally once a day Active  Fish Oil 1000 mg oral capsule  orally once a day Active  biotin 1000 mcg oral tablet 1 tab(s) orally once a day Active  Melatonin 5 mg oral tablet 1 tab(s) orally once a day (at bedtime) Active  B Complex 50 1   once a day Active  D3-5 400 milligram(s) orally once a day Active  Allergy 10 milligram(s) orally once a day Active   Allergies:   Sulfa drugs: Swelling  Tobramycin: Swelling  "all mycins" cause joint swelling  : Swelling  Vital Signs/Notes:  Nursing Vital Signs:  **Vital Signs.:   25-Sep-14 09:31  Vital Signs Type Routine; perinatal clinic  Temperature Temperature (F) 97.2  Celsius 36.2   Pulse Pulse 84  Respirations Respirations 20  Systolic BP Systolic BP 702  Diastolic BP (mmHg) Diastolic BP (mmHg) 57  Mean BP 73  Pulse Ox % Pulse Ox % 99  Oxygen Delivery Room Air/ 21 %   Perinatal Consult:  PGyn Hx History of LSIL in 2005; normal PAPs since    PMed Hx Rubella Immune, Hx of varicella   Past Medical History cont'd 1.  Menstrual migraines - usually treated with Imitrex and Odansetron 2.  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  Significant family history as well (see separate genetics consult note).  Patient has typical foot findings and peripheral neuropathy, some symptoms in her hands and arms more recently.  Is followed by Dr. Adria Devon at [facility] and has seen orthopedics (in [place]) as well but neither since her pregnancy.  She has had no formal gene testing to identify which type of CMT she has.   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  The patient is aware of the potential 50% risk for her child to also be affected with CMT.  See separate genetics consult for details on that discussion.   Recommendations We discussed that it was likely that she was experiencing both the normal manifestations of pregnancy which were likely exacerbating her baseline neuropathy.  We discussed that I was  unconvinced more steroids were likely to help since this is not thought to be an inflammatory process (although she saw a podiatrist earlier this pregnancy, no x-rays were performe, but it's possible there is some joint inflammation).  I do feel strongly that her care be co-managed by her neurologist who may be able to suggest better medication for her neuropathic pain, such as gabapentin.  Although this medication made her sleepy in the past, there may be alternatives or it may be somthing that could be taken at a different time of day to help combat some of the side effects.  She did say that this particular medication helped in the past.  We discussed methods to help reduce her swelling including appropriately sized compression hose.  She was having some problems with the knee-highs and it may be that thigh-highs would be more comfortable.  We discussed elevating her legs when sitting at work or when home at night.  I encouraged her to try warms baths or whirlpool therapy to help with the circulation.  We also discussed trying to find a shoe that would accomodate her swelling and the orthodics that the podiatrist had recommended.  Ultimately, it may also be helpful to go back and see if Ortho would have any additional suggestions.    As Charcot-Marie-Tooth syndrome can sometimes affect anethestic choices, it may be helpful to have this patient have an anesthesia consult in the third trimester.  There have been some reports of an increased risk for malpresentation and bleeding after delivery but this was an article is almost 32 years old and was published in an Anesthesia Journal.  Review of the obstetric literature are mostly case reports but do not suggest an association with surgical bleeding or postpartum hemorrhage.  Consider ultrasound for growth in the third trimester.   Plan:  Genetic Counseling yes    Total Time Spent with Patient 40 minutes   >50% of visit spent in couseling/coordination of  care yes   Office Use Only 99[address]  Level 3 (4min) NEW office consult detailed   Coding Description: MATERNAL CONDITIONS/HISTORY INDICATION(S).   OTHER: Charcot-Marie-Tooth syndrome.  Electronic Signatures: Wynona Neat (MD)  (Signed 25-Sep-14 12:08)  Authored: Referral, Home Medications, Allergies, Vital Signs/Notes, Consult, Exam, Impression, Plan, Billing, Coding Description   Last Updated: 25-Sep-14 12:08 by Wynona Neat (MD)

## 2014-06-14 NOTE — Op Note (Signed)
PATIENT NAME:  Nichole Mcneil, GARROD MR#:  [id number] DATE OF BIRTH:  04-29-1980  DATE OF PROCEDURE:  12/27/2013  PREOPERATIVE DIAGNOSIS: Desire for permanent sterility.   POSTOPERATIVE DIAGNOSIS: Desire for permanent sterility.   PROCEDURE: Laparoscopy with bilateral tubal ligation.   SURGEON: Glean Salen, M.D.   ANESTHESIA: General.   ESTIMATED BLOOD LOSS: Minimal.   COMPLICATIONS: None.   FINDINGS: Normal tubes, ovaries, and uterus as well as appendix, liver, gallbladder, small bowel and colon.   DISPOSITION: To the recovery room in stable condition.   TECHNIQUE: The patient is prepped and draped in the usual sterile fashion, after adequate anesthesia is obtained, in the dorsal lithotomy position. The bladder is drained with a Robinson catheter and the Hulka tenaculum is placed on the cervix for manipulation purposes.   Our attention is then turned to the abdomen where a Veress needle is inserted through a 5 mm infraumbilical incision after Marcaine is used to anesthetize the skin. Veress needle placement is confirmed using the hanging drop technique and the abdomen is then insufflated with CO2 gas. A 5 mm trocar is then inserted under direct visualization with the laparoscope with no injuries or bleeding noted.   The patient is placed in Trendelenburg position and the above-mentioned findings are visualized. An 11 mm trocar is placed in the suprapubic region with no injuries or bleeding noted. The left and right fallopian tubes are identified out to their fimbria and then in the midportion, ampullary portion of each tube, 2 Hulka clips are placed using the Hulka clip applicator. Placement is confirmed and no bleeding is noted. The patient is leveled, gas is expelled, trocars are removed and Dermabond is used to close the skin. The patient goes to the recovery room in stable condition. The tenaculum is removed from the cervix. All sponge, instrument, and needle counts are  correct.   ____________________________ R. Barnett Applebaum, MD rph:sb D: 12/27/2013 10:10:42 ET T: 12/27/2013 10:46:39 ET JOB#: [id number]  cc: Glean Salen, MD, <Dictator> Gae Dry MD ELECTRONICALLY SIGNED 12/31/2013 10:33

## 2014-06-15 NOTE — Op Note (Signed)
PATIENT NAME:  Nichole Mcneil, Nichole Mcneil MR#:  [id number] DATE OF BIRTH:  06-May-1980  DATE OF PROCEDURE:  08/05/2011  PREOPERATIVE DIAGNOSIS: Left ovarian cyst and acute pelvic pain.   POSTOPERATIVE DIAGNOSIS: Left ovarian cyst and acute pelvic pain.  PROCEDURE PERFORMED: Operative laparoscopy with left ovarian cystectomy.   SURGEON: Barnett Applebaum, MD  ASSISTANT: Will Bonnet, MD   ANESTHESIA: General.   ESTIMATED BLOOD LOSS: Minimal.   COMPLICATIONS: None.   FINDINGS: Left serous cystadenoma of the ovary.   DISPOSITION: To the recovery room in stable condition.   TECHNIQUE: The patient is prepped and draped in the usual sterile fashion after adequate anesthesia is attained in the dorsal lithotomy position. The bladder is drained, and a tenaculum is placed on the anterior lip of the cervix. The uterus is sounded to 6 cm, and an acorn uterine manipulator is placed.   A Veress needle is inserted through a 5 mm infraumbilical incision after Marcaine is used to anesthetize the skin. Veress needle placement is confirmed using the hanging drop technique, and the abdomen is then insufflated with CO2 gas. A 5 mm trocar is then inserted under direct visualization with the laparoscope with no injuries or bleeding noted. The patient is placed in Trendelenburg positioning, and the above-mentioned findings are visualized. An 11 mm trocar is placed in the right lower quadrant, and a 5 mm trocar is placed in the left lower quadrant with placement lateral to the inferior epigastric blood vessels and no injuries or bleeding noted. The left ovarian cyst is grasped and manipulated into a portion where we can see the apex of the cyst. A suction irrigator is pierced through the thin wall in this area of the cyst with aspiration of clear yellow fluid from the cyst. A grasping forceps is then used to grasp the cyst wall, and in almost one piece in its entirety it is removed. Additional cyst wall is also removed.  Hemostasis is assured. The pelvic cavity is irrigated with saline with aspiration of all fluids. The pelvis otherwise appears normal, normal right ovary, uterus, fallopian tubes, appendix, gallbladder, liver, bowel, and colon. Minimal to no adhesions. No injury to bowel, bladder, ureter or other structures is visualized at the conclusion of the case. Gas is expelled. The patient is leveled. Trocars are removed. The skin is closed with Dermabond. The tenaculum is removed from the uterus along with the manipulator, and the patient goes to the recovery room in stable condition. All sponge, instrument, and needle counts are correct.   ____________________________ R. Barnett Applebaum, MD rph:cbb D: 08/05/2011 13:44:31 ET T: 08/05/2011 16:34:16 ET JOB#: [id number]  cc: Glean Salen, MD, <Dictator> Gae Dry MD ELECTRONICALLY SIGNED 08/09/2011 8:37

## 2014-06-16 ENCOUNTER — Ambulatory Visit (INDEPENDENT_AMBULATORY_CARE_PROVIDER_SITE_OTHER): Payer: BLUE CROSS/BLUE SHIELD | Admitting: Family Medicine

## 2014-06-16 ENCOUNTER — Encounter: Payer: Self-pay | Admitting: Family Medicine

## 2014-06-16 VITALS — BP 100/76 | Temp 98.2°F | Ht 59.0 in | Wt 148.7 lb

## 2014-06-16 DIAGNOSIS — B029 Zoster without complications: Secondary | ICD-10-CM | POA: Diagnosis not present

## 2014-06-16 MED ORDER — METHYLPREDNISOLONE 4 MG PO TBPK: ORAL_TABLET | ORAL | Status: DC

## 2014-06-16 MED ORDER — VALACYCLOVIR HCL 1 G PO TABS: 1000.0000 mg | ORAL_TABLET | Freq: Three times a day (TID) | ORAL | Status: DC

## 2014-06-16 NOTE — Progress Notes (Signed)
Pre visit review using our clinic review tool, if applicable. No additional management support is needed unless otherwise documented below in the visit note. 

## 2014-06-16 NOTE — Progress Notes (Signed)
   Subjective:    Patient ID: Nichole Mcneil, female    DOB: 09/24/1980, 34 y.o.   MRN: [id number]  HPI Here for 3 days of a painful rash on the left flank beneath the breast. She has been applying Neosporin.    Review of Systems  Constitutional: Negative.   Skin: Positive for rash.       Objective:   Physical Exam  Constitutional: She appears well-developed and well-nourished.  Skin:  There is a small patch of red vesicles on the left flank           Assessment & Plan:  Shingles. Treat with Valtrex and a Medrol dose pack. She has Tramadol at home she can use for pain if needed. Recheck prn

## 2014-07-01 NOTE — H&P (Signed)
L&D Evaluation:  History:  HPI 34 year old G1 P0 with EDC=02/26/2013 by a 6wk1d ultrasound presents at 79 1/7 weeks with c/o regular ctxs since 0820 this AM.Contractions inconsistent in strength. Has had bloody show, but no LOF. PNC at [facility] begun in first trimester and remarkable for receiving a Prednisone taper for foot pain and lower extremity edema R/T the Lelan Pons Charcot Tooth disease, and Rhogam at 28 weeks as she is RH negative. LABS: A neg, RI, VI, GBS negative.   Presents with contractions   Patient's Medical History Marie Charcot Tooth Disease, common migraine   Patient's Surgical History laparoscopy and left ovarian cystectomy in 2013   Medications Pre Natal Vitamins   Allergies sulfa and "mycin" drugs-cause swelling   Social History none   Family History Mother, MGM and mat uncle with MCT   ROS:  ROS see HPI   Exam:  Vital Signs stable   Urine Protein not completed   General no apparent distress   Mental Status clear   Abdomen gravid, tender with contractions   Estimated Fetal Weight Average for gestational age   Fetal Position ROT   Edema 2+   Pelvic no external lesions, on arrival tight 3/80-90% and -1. After 4 hours of observation-no cx change. some bloody show-decreased over 4 hours   Mebranes Intact   FHT normal rate with no decels, baseline 125 with accels to 140s, CAT 1   Ucx irregular, sometimes q4, many lasting 30-40 sec   Skin dry   Impression:  Impression IUP at 37 1/7 weeks with prodromal vs early labor. No cx change over 4 hours. Reactive NST   Plan:  Plan Discharge home with labor precautions. FU on 22 Dec in office if [place]. Encouraged warm bath, hydration, eating   Electronic Signatures: Karene Fry (CNM)  (Signed 17-Dec-14 17:16)  Authored: L&D Evaluation   Last Updated: 17-Dec-14 17:16 by Karene Fry (CNM)

## 2014-07-01 NOTE — H&P (Signed)
L&D Evaluation:  History:  HPI 34 year old G1 P0 with EDC=02/26/2013 by a 6wk1d ultrasound presents at 53 1/7 weeks with c/o SROM last night at 2230 followed by onset stronger, frequent contractions at 2245.   No VB. PNC at [facility] begun in first trimester and remarkable for receiving a Prednisone taper for foot pain and lower extremity edema R/T the Lelan Pons Charcot Tooth disease, and Rhogam at 28 weeks as she is RH negative. LABS: A neg, RI, VI, GBS negative.   Presents with contractions, leaking fluid   Patient's Medical History Marie Charcot Tooth Disease, common migraine   Patient's Surgical History laparoscopy and left ovarian cystectomy in 2013   Medications Pre Natal Vitamins   Allergies sulfa and "mycin" drugs-cause swelling of joints   Social History none   Family History Mother, MGM and mat uncle with MCT   ROS:  ROS see HPI   Exam:  Vital Signs stable  120/75, 125/76   Urine Protein not completed   General appears anxious   Mental Status clear   Chest clear    Heart normal sinus rhythm, no murmur/gallop/rubs   Abdomen gravid, tender with contractions   Estimated Fetal Weight Average for gestational age   Fetal Position vtx   Edema 2+  ankle/foot   Reflexes 1+    Pelvic no external lesions, on arrival tight 3.5/C /-1 to 0 at 0024.  now 4/C/ per RN exam.   Mebranes Ruptured   Description clear   FHT normal rate with no decels, baseline 135 with accels to 150s, CAT 1 on arrival, now baseline 125-130 after Stadol   FHT Description occasional 20 sec variable decel with quick rtn to baseline   Fetal Heart Rate 130    Ucx regular, frequent q1 min, x40-50sec   Skin dry   Other CBC: WBC=21.8 and H&H=11.9/35.3   Impression:  Impression IUP at 38 1/7 weeks with SROM and early labor.  Leukocytosis-no fever.   Plan:  Plan EFM/NST, monitor contractions and for cervical change, Stadol for pain. Epidural if anesthesia will do with leukocytosis. Consider  Pitocin if labor progress stalls.   Electronic Signatures: Karene Fry (CNM)  (Signed 24-Dec-14 02:57)  Authored: L&D Evaluation   Last Updated: 24-Dec-14 02:57 by Karene Fry (CNM)

## 2014-09-24 ENCOUNTER — Encounter: Payer: Self-pay | Admitting: Family Medicine

## 2014-09-24 ENCOUNTER — Ambulatory Visit (INDEPENDENT_AMBULATORY_CARE_PROVIDER_SITE_OTHER): Payer: BLUE CROSS/BLUE SHIELD | Admitting: Family Medicine

## 2014-09-24 VITALS — BP 119/88 | HR 83 | Temp 97.6°F | Ht 59.0 in | Wt 148.0 lb

## 2014-09-24 DIAGNOSIS — J019 Acute sinusitis, unspecified: Secondary | ICD-10-CM

## 2014-09-24 MED ORDER — ALBUTEROL SULFATE HFA 108 (90 BASE) MCG/ACT IN AERS: 2.0000 | INHALATION_SPRAY | RESPIRATORY_TRACT | Status: DC | PRN

## 2014-09-24 MED ORDER — AMOXICILLIN-POT CLAVULANATE 875-125 MG PO TABS: 1.0000 | ORAL_TABLET | Freq: Two times a day (BID) | ORAL | Status: DC

## 2014-09-24 MED ORDER — METHYLPREDNISOLONE ACETATE 80 MG/ML IJ SUSP
120.0000 mg | Freq: Once | INTRAMUSCULAR | Status: AC
  Administered 2014-09-24: 120 mg via INTRAMUSCULAR

## 2014-09-24 NOTE — Addendum Note (Signed)
Addended by: Alysia Penna A on: 09/24/2014 05:02 PM   Modules accepted: Orders

## 2014-09-24 NOTE — Progress Notes (Signed)
   Subjective:    Patient ID: Nichole Mcneil, female    DOB: 19-Mar-1980, 34 y.o.   MRN: [id number]  HPI Here for one week of sinus pressure, PND, ST, and a dry cough. Using Mucinex.    Review of Systems  Constitutional: Negative.   HENT: Positive for congestion, postnasal drip and sinus pressure.   Eyes: Negative.   Respiratory: Positive for cough.        Objective:   Physical Exam  Constitutional: She appears well-developed and well-nourished.  HENT:  Right Ear: External ear normal.  Left Ear: External ear normal.  Nose: Nose normal.  Mouth/Throat: Oropharynx is clear and moist.  Eyes: Conjunctivae are normal.  Neck: No thyromegaly present.  Cardiovascular: Normal rate, regular rhythm, normal heart sounds and intact distal pulses.   Pulmonary/Chest: Effort normal and breath sounds normal.  Lymphadenopathy:    She has no cervical adenopathy.          Assessment & Plan:  Sinusitis, treat with a steroid shot and Augmentin.

## 2014-09-24 NOTE — Progress Notes (Signed)
Pre visit review using our clinic review tool, if applicable. No additional management support is needed unless otherwise documented below in the visit note. 

## 2014-09-24 NOTE — Addendum Note (Signed)
Addended by: Aggie Hacker A on: 09/24/2014 12:23 PM   Modules accepted: Orders

## 2014-10-22 ENCOUNTER — Other Ambulatory Visit: Payer: Self-pay | Admitting: Family Medicine

## 2014-10-22 NOTE — Telephone Encounter (Signed)
Pt seen 8/3, dx w/ sinus inf. Pt states she is feeling the same way again Would like to know if dr fry will send  Prednisone in case she gets stopped up at nite  Like she did last time  Also pt states she has had several sinus inf this past yr, has gone to [facility] for some of them would like to know if there is rx she could take as a preventative measure  [facility] [address]

## 2014-10-23 MED ORDER — METHYLPREDNISOLONE 4 MG PO TBPK: ORAL_TABLET | ORAL | Status: DC

## 2014-10-23 MED ORDER — AMOXICILLIN-POT CLAVULANATE 875-125 MG PO TABS: 1.0000 | ORAL_TABLET | Freq: Two times a day (BID) | ORAL | Status: DC

## 2014-10-23 NOTE — Telephone Encounter (Signed)
Called and spoke with pt and pt is aware. Advised pt of Dr. Dorene Ar recommendations and pt verbalized understanding. Rx sent to pharmacy.

## 2014-10-23 NOTE — Telephone Encounter (Signed)
Call in Augmentin [phone] bid for 10 days, also call in a Medrol does pack. I suggest she use Flonase every day to prevent these infections

## 2015-01-04 ENCOUNTER — Emergency Department (HOSPITAL_COMMUNITY)
Admission: EM | Admit: 2015-01-04 | Discharge: 2015-01-04 | Disposition: A | Discharge status: Home / Self Care | Payer: BLUE CROSS/BLUE SHIELD | Source: Home / Self Care | Attending: Family Medicine | Admitting: Family Medicine

## 2015-01-04 DIAGNOSIS — J069 Acute upper respiratory infection, unspecified: Secondary | ICD-10-CM | POA: Diagnosis not present

## 2015-01-04 DIAGNOSIS — R5383 Other fatigue: Secondary | ICD-10-CM | POA: Diagnosis not present

## 2015-01-04 DIAGNOSIS — R0981 Nasal congestion: Secondary | ICD-10-CM | POA: Diagnosis not present

## 2015-01-04 MED ORDER — PREDNISOLONE 5 MG (21) PO TBPK: 5.0000 mg | ORAL_TABLET | Freq: Every day | ORAL | Status: DC

## 2015-01-04 MED ORDER — PREDNISONE 5 MG (21) PO TBPK: 5.0000 mg | ORAL_TABLET | Freq: Every day | ORAL | Status: DC

## 2015-01-04 NOTE — ED Provider Notes (Signed)
CSN: [id number]     Arrival date & time 01/04/15  1329 History   First MD Initiated Contact with Patient 01/04/15 1431     Chief Complaint  Patient presents with  . URI   (Consider location/radiation/quality/duration/timing/severity/associated sxs/prior Treatment) HPI Comments: Patient presents with nasal congestion, fatigue and left ear pain for 2 days. No fever or chills. No cough. She also brings her son who has similar symptoms. She carries a history of asthma/allergies as well.   Patient is a 34 y.o. female presenting with URI. The history is provided by the patient.  URI Presenting symptoms: congestion, ear pain, fatigue and sore throat   Presenting symptoms: no cough and no fever   Associated symptoms: no wheezing     Past Medical History  Diagnosis Date  . Muscular dystrophy     at age 6   . Muscular dystrophy   . Charcot Marie Tooth muscular atrophy   . Migraines    No past surgical history on file. Family History  Problem Relation Age of Onset  . Muscular dystrophy Mother   . Hyperlipidemia Mother   . Endometriosis Mother   . Migraines Father   . Nephrolithiasis Father   . Hypertension Father   . Arthritis Brother   . Aneurysm Maternal Uncle   . Stroke Maternal Grandmother   . Aneurysm Maternal Grandfather   . Migraines Paternal Grandmother   . Cancer Paternal Grandfather     lung   Social History  Substance Use Topics  . Smoking status: Never Smoker   . Smokeless tobacco: Never Used  . Alcohol Use: 1.2 oz/week    2 Glasses of wine per week     Comment: occ   OB History    No data available     Review of Systems  Constitutional: Positive for fatigue. Negative for fever and chills.  HENT: Positive for congestion, ear pain, sinus pressure and sore throat.   Eyes: Negative.   Respiratory: Negative for cough, shortness of breath and wheezing.   Skin: Negative.     Allergies  Erythromycin; Metronidazole; Nitrofurantoin; and Sulfa  antibiotics  Home Medications   Prior to Admission medications   Medication Sig Start Date End Date Taking? Authorizing Provider  albuterol (PROVENTIL HFA;VENTOLIN HFA) 108 (90 BASE) MCG/ACT inhaler Inhale 2 puffs into the lungs every 4 (four) hours as needed for wheezing or shortness of breath. 09/24/14   Laurey Morale, MD  amoxicillin-clavulanate (AUGMENTIN) 875-125 MG per tablet Take 1 tablet by mouth 2 (two) times daily. 10/23/14   Laurey Morale, MD  methylPREDNISolone (MEDROL DOSEPAK) 4 MG TBPK tablet Take as directed 10/23/14   Laurey Morale, MD  PrednisoLONE 5 MG (21) TBPK Take 5 mg by mouth daily. 01/04/15 01/10/15  Bjorn Pippin, PA-C   Meds Ordered and Administered this Visit  Medications - No data to display  BP 124/83 mmHg  Pulse 72  Temp(Src) 98.4 F (36.9 C) (Oral)  Resp 16  SpO2 100% No data found.   Physical Exam  Constitutional: She is oriented to person, place, and time. She appears well-developed and well-nourished. No distress.  HENT:  Head: Normocephalic and atraumatic.  Right Ear: External ear normal.  Left Ear: External ear normal.  Mouth/Throat: Oropharynx is clear and moist.  Swollen and erythematous nasal turbinates  Neck: Normal range of motion.  Cardiovascular: Normal rate and regular rhythm.   Pulmonary/Chest: Effort normal and breath sounds normal.  Lymphadenopathy:    She has no cervical  adenopathy.  Neurological: She is alert and oriented to person, place, and time.  Skin: Skin is warm and dry. She is not diaphoretic.  Psychiatric: Her behavior is normal.  Nursing note and vitals reviewed.   ED Course  Procedures (including critical care time)  Labs Review Labs Reviewed - No data to display  Imaging Review No results found.   Visual Acuity Review  Right Eye Distance:   Left Eye Distance:   Bilateral Distance:    Right Eye Near:   Left Eye Near:    Bilateral Near:         MDM   1. URI, acute   2. Nasal congestion   3.  Other fatigue    No indication for an antibiotic as I explained to patient. This appears to be a viral illness. She has responded well to Prednisone in the past, ok to give a short 6 day course of Prednisone (corrected on the actual written RX). Please f/u with your PCP if you are not getting better within 1-2 weeks.     Bjorn Pippin, PA-C 01/04/15 [phone]

## 2015-01-04 NOTE — Discharge Instructions (Signed)
Upper Respiratory Infection, Adult Most upper respiratory infections (URIs) are caused by a virus. A URI affects the nose, throat, and upper air passages. The most common type of URI is often called "the common cold." HOME CARE   Take medicines only as told by your doctor.  Gargle warm saltwater or take cough drops to comfort your throat as told by your doctor.  Use a warm mist humidifier or inhale steam from a shower to increase air moisture. This may make it easier to breathe.  Drink enough fluid to keep your pee (urine) clear or pale yellow.  Eat soups and other clear broths.  Have a healthy diet.  Rest as needed.  Go back to work when your fever is gone or your doctor says it is okay.  You may need to stay home longer to avoid giving your URI to others.  You can also wear a face mask and wash your hands often to prevent spread of the virus.  Use your inhaler more if you have asthma.  Do not use any tobacco products, including cigarettes, chewing tobacco, or electronic cigarettes. If you need help quitting, ask your doctor. GET HELP IF:  You are getting worse, not better.  Your symptoms are not helped by medicine.  You have chills.  You are getting more short of breath.  You have brown or red mucus.  You have yellow or brown discharge from your nose.  You have pain in your face, especially when you bend forward.  You have a fever.  You have puffy (swollen) neck glands.  You have pain while swallowing.  You have white areas in the back of your throat. GET HELP RIGHT AWAY IF:   You have very bad or constant:  Headache.  Ear pain.  Pain in your forehead, behind your eyes, and over your cheekbones (sinus pain).  Chest pain.  You have long-lasting (chronic) lung disease and any of the following:  Wheezing.  Long-lasting cough.  Coughing up blood.  A change in your usual mucus.  You have a stiff neck.  You have changes in  your:  Vision.  Hearing.  Thinking.  Mood. MAKE SURE YOU:   Understand these instructions.  Will watch your condition.  Will get help right away if you are not doing well or get worse.   This information is not intended to replace advice given to you by your health care provider. Make sure you discuss any questions you have with your health care provider.   No signs of a bacterial infection. No indication for an antibiotic at this time. Ok to try a Prednisone pack for inflammation and congestion. Suggest use of Mucinex D +- Flonase. Feel better soon as virus infections need to run their course    Document Released: 07/27/2007 Document Revised: 06/24/2014 Document Reviewed: 05/15/2013 Elsevier Interactive Patient Education 2016 [employer].

## 2015-01-04 NOTE — ED Notes (Signed)
Patient complains of being congested and having some pain to her left ear

## 2015-01-21 ENCOUNTER — Encounter: Payer: Self-pay | Admitting: Family Medicine

## 2015-01-21 ENCOUNTER — Ambulatory Visit (INDEPENDENT_AMBULATORY_CARE_PROVIDER_SITE_OTHER): Payer: BLUE CROSS/BLUE SHIELD | Admitting: Family Medicine

## 2015-01-21 VITALS — BP 107/84 | HR 67 | Temp 98.3°F | Ht 59.0 in | Wt 150.0 lb

## 2015-01-21 DIAGNOSIS — J019 Acute sinusitis, unspecified: Secondary | ICD-10-CM

## 2015-01-21 DIAGNOSIS — H109 Unspecified conjunctivitis: Secondary | ICD-10-CM | POA: Diagnosis not present

## 2015-01-21 MED ORDER — AMOXICILLIN-POT CLAVULANATE 875-125 MG PO TABS: 1.0000 | ORAL_TABLET | Freq: Two times a day (BID) | ORAL | Status: DC

## 2015-01-21 NOTE — Progress Notes (Signed)
   Subjective:    Patient ID: Nichole Mcneil, female    DOB: [birth year], 34 y.o.   MRN: [id number]  HPI Here for one week of sinus pressure, PND, HA, and blowing green mucus from the nose. No fever or coughing. Using  Mucinex D and a Netty Pot. Also she has had 5 days of both eyes being red and itcy, with mucus build up along the lids. Her boss, Dr. Katy Fitch, gave her samples of Polytrim drops to use but these have not helped.    Review of Systems  Constitutional: Negative.   HENT: Positive for congestion, postnasal drip and sinus pressure. Negative for ear pain, facial swelling and sore throat.   Eyes: Positive for discharge, redness and itching. Negative for pain.  Respiratory: Negative.        Objective:   Physical Exam  Constitutional: She appears well-developed and well-nourished.  HENT:  Right Ear: External ear normal.  Left Ear: External ear normal.  Nose: Nose normal.  Mouth/Throat: Oropharynx is clear and moist.  Eyes: Pupils are equal, round, and reactive to light. Right eye exhibits no discharge. Left eye exhibits no discharge.  Both conjunctivae are red   Neck: Neck supple. No thyromegaly present.  Pulmonary/Chest: Effort normal and breath sounds normal.  Lymphadenopathy:    She has no cervical adenopathy.          Assessment & Plan:  Treat the sinus infection with Augmentin. Treat the conjunctivitis with Tobradex drops (she can get samples at her work)

## 2015-01-21 NOTE — Progress Notes (Signed)
Pre visit review using our clinic review tool, if applicable. No additional management support is needed unless otherwise documented below in the visit note. 

## 2015-01-30 ENCOUNTER — Encounter: Payer: Self-pay | Admitting: Family Medicine

## 2015-01-30 ENCOUNTER — Ambulatory Visit (INDEPENDENT_AMBULATORY_CARE_PROVIDER_SITE_OTHER): Payer: 59 | Admitting: Family Medicine

## 2015-01-30 VITALS — BP 103/77 | HR 90 | Temp 98.9°F | Ht 59.0 in | Wt 150.0 lb

## 2015-01-30 DIAGNOSIS — J019 Acute sinusitis, unspecified: Secondary | ICD-10-CM

## 2015-01-30 MED ORDER — LEVOFLOXACIN 500 MG PO TABS: 500.0000 mg | ORAL_TABLET | Freq: Every day | ORAL | Status: AC

## 2015-01-30 MED ORDER — HYDROCODONE-HOMATROPINE 5-1.5 MG/5ML PO SYRP: 5.0000 mL | ORAL_SOLUTION | ORAL | Status: DC | PRN

## 2015-01-30 NOTE — Progress Notes (Signed)
   Subjective:    Patient ID: Nichole Mcneil, female    DOB: [birth year], 34 y.o.   MRN: [id number]  HPI Here for continued sinus congestion and a dry cough. She finished a course of Augmentin but has not improved.    Review of Systems  Constitutional: Negative.   HENT: Negative.   Eyes: Negative.   Respiratory: Positive for cough and chest tightness. Negative for shortness of breath and wheezing.        Objective:   Physical Exam  Constitutional: She appears well-developed and well-nourished.  HENT:  Right Ear: External ear normal.  Left Ear: External ear normal.  Nose: Nose normal.  Mouth/Throat: Oropharynx is clear and moist.  Eyes: Conjunctivae are normal.  Neck: No thyromegaly present.  Pulmonary/Chest: Effort normal and breath sounds normal.  Lymphadenopathy:    She has no cervical adenopathy.          Assessment & Plan:  Partially treated sinusitis. Start on Levaquin.

## 2015-01-30 NOTE — Progress Notes (Signed)
Pre visit review using our clinic review tool, if applicable. No additional management support is needed unless otherwise documented below in the visit note. 

## 2015-02-20 ENCOUNTER — Ambulatory Visit (INDEPENDENT_AMBULATORY_CARE_PROVIDER_SITE_OTHER): Payer: 59 | Admitting: Family Medicine

## 2015-02-20 ENCOUNTER — Encounter: Payer: Self-pay | Admitting: Family Medicine

## 2015-02-20 VITALS — BP 107/87 | HR 94 | Temp 98.2°F | Ht 59.0 in | Wt 150.0 lb

## 2015-02-20 DIAGNOSIS — Z91048 Other nonmedicinal substance allergy status: Secondary | ICD-10-CM

## 2015-02-20 DIAGNOSIS — Z9109 Other allergy status, other than to drugs and biological substances: Secondary | ICD-10-CM

## 2015-02-20 NOTE — Progress Notes (Signed)
Pre visit review using our clinic review tool, if applicable. No additional management support is needed unless otherwise documented below in the visit note. 

## 2015-02-20 NOTE — Progress Notes (Signed)
   Subjective:    Patient ID: Nichole Mcneil, female    DOB: [birth year], 34 y.o.   MRN: [id number]  HPI Here for 3 days of stuffy head and nose. No fever or ST or coughing. Using Mucinex. She had a lot of allergy problems as a child, and she wonders if these are coming back.    Review of Systems  Constitutional: Negative.   HENT: Positive for congestion, postnasal drip, sinus pressure and sneezing. Negative for ear pain and sore throat.   Eyes: Negative.   Respiratory: Negative.        Objective:   Physical Exam  Constitutional: She appears well-developed and well-nourished.  HENT:  Right Ear: External ear normal.  Left Ear: External ear normal.  Nose: Nose normal.  Mouth/Throat: Oropharynx is clear and moist.  Eyes: Conjunctivae are normal.  Neck: No thyromegaly present.  Pulmonary/Chest: Effort normal and breath sounds normal.  Lymphadenopathy:    She has no cervical adenopathy.  Skin: No rash noted. No erythema.          Assessment & Plan:  These sound like allergy symptoms. She will try using Allegra D and Flonase daily. Recheck prn

## 2015-05-05 ENCOUNTER — Other Ambulatory Visit: Payer: Self-pay | Admitting: Family Medicine

## 2015-05-05 MED ORDER — SUMATRIPTAN SUCCINATE 100 MG PO TABS: 100.0000 mg | ORAL_TABLET | ORAL | Status: DC | PRN

## 2015-07-10 ENCOUNTER — Telehealth: Payer: Self-pay | Admitting: Family Medicine

## 2015-07-10 ENCOUNTER — Ambulatory Visit (INDEPENDENT_AMBULATORY_CARE_PROVIDER_SITE_OTHER): Payer: 59 | Admitting: Family Medicine

## 2015-07-10 ENCOUNTER — Encounter: Payer: Self-pay | Admitting: Family Medicine

## 2015-07-10 VITALS — BP 124/88 | HR 91 | Temp 97.9°F | Resp 16 | Ht 59.0 in | Wt 148.0 lb

## 2015-07-10 DIAGNOSIS — K602 Anal fissure, unspecified: Secondary | ICD-10-CM

## 2015-07-10 NOTE — Telephone Encounter (Signed)
[facility] - Client [place] [facility] Patient Name: Nichole Mcneil DOB: 23-Dec-1980 Initial Comment Caller states just went to the restroom and it was extremely hard to go. started bleeding after BM Nurse Assessment Nurse: Dimas Chyle, RN, Dellis Filbert Date/Time Eilene Ghazi Time): 07/10/2015 10:29:47 AM Confirm and document reason for call. If symptomatic, describe symptoms. You must click the next button to save text entered. ---Caller states just went to the restroom and it was extremely hard to go. started bleeding after BM. Still having bleeding. Has the patient traveled out of the country within the last 30 days? ---No Does the patient have any new or worsening symptoms? ---Yes Will a triage be completed? ---Yes Related visit to physician within the last 2 weeks? ---No Does the PT have any chronic conditions? (i.e. diabetes, asthma, etc.) ---No Is the patient pregnant or possibly pregnant? (Ask all females between the ages of 67-55) ---No Is this a behavioral health or substance abuse call? ---No Guidelines Guideline Title Affirmed Question Affirmed Notes Rectal Bleeding MODERATE rectal bleeding (small blood clots, passing blood without stool, or toilet water turns red) Final Disposition User See Physician within 24 Hours Dimas Chyle, RN, [employer] Referrals REFERRED TO PCP OFFICE Disagree/Comply: Leta [facility]

## 2015-07-10 NOTE — Progress Notes (Signed)
   Subjective:    Patient ID: Nichole Mcneil, female    DOB: 17-May-1980, 35 y.o.   MRN: [id number]  HPI Here for 3 days of intermittent passing of bright red blood per rectum. Her stools tend to be firm and hard, and they are sometimes hard to pass. The day this bleeding started she had to strain quite a bit to pass a stool and this was painful. No other abdominal pains.    Review of Systems  Constitutional: Negative.   Gastrointestinal: Positive for constipation, blood in stool, anal bleeding and rectal pain. Negative for diarrhea and abdominal distention.       Objective:   Physical Exam  Constitutional: She appears well-developed and well-nourished.  Abdominal: Soft. Bowel sounds are normal. She exhibits no distension and no mass. There is no tenderness. There is no rebound and no guarding.  Genitourinary:  There is a small tear at the anal verge, no active bleeding          Assessment & Plan:  Anal fissure. Use witch hazel for comfort. She will start using Miralax daily to soften the stools.  Laurey Morale, MD

## 2015-07-10 NOTE — Telephone Encounter (Signed)
FYI.  Pt has appt today with Dr. Sarajane Jews.

## 2015-07-10 NOTE — Progress Notes (Signed)
Pre visit review using our clinic review tool, if applicable. No additional management support is needed unless otherwise documented below in the visit note. 

## 2015-10-09 ENCOUNTER — Encounter: Payer: Self-pay | Admitting: Family Medicine

## 2015-10-09 ENCOUNTER — Ambulatory Visit (INDEPENDENT_AMBULATORY_CARE_PROVIDER_SITE_OTHER): Payer: 59 | Admitting: Family Medicine

## 2015-10-09 VITALS — BP 102/74 | HR 79 | Temp 97.9°F | Ht 59.0 in | Wt 148.0 lb

## 2015-10-09 DIAGNOSIS — J019 Acute sinusitis, unspecified: Secondary | ICD-10-CM

## 2015-10-09 MED ORDER — LEVOFLOXACIN 500 MG PO TABS: 500.0000 mg | ORAL_TABLET | Freq: Every day | ORAL | 0 refills | Status: AC

## 2015-10-09 MED ORDER — METHYLPREDNISOLONE 4 MG PO TBPK: ORAL_TABLET | ORAL | 0 refills | Status: DC

## 2015-10-09 NOTE — Progress Notes (Signed)
Pre visit review using our clinic review tool, if applicable. No additional management support is needed unless otherwise documented below in the visit note. 

## 2015-10-09 NOTE — Progress Notes (Signed)
   Subjective:    Patient ID: Nichole Mcneil, female    DOB: 01-04-1981, 35 y.o.   MRN: [id number]  HPI Here for 2 weeks of sinus pressure, left ear pain, PND, and a ST. No fever. On Mucinex.    Review of Systems  Constitutional: Negative.   HENT: Positive for congestion, ear pain, postnasal drip, sinus pressure and sore throat.   Eyes: Negative.   Respiratory: Negative.        Objective:   Physical Exam  Constitutional: She appears well-developed and well-nourished.  HENT:  Right Ear: External ear normal.  Left Ear: External ear normal.  Nose: Nose normal.  Mouth/Throat: Oropharynx is clear and moist.  Eyes: Conjunctivae are normal.  Neck: No thyromegaly present.  Pulmonary/Chest: Effort normal and breath sounds normal.  Lymphadenopathy:    She has no cervical adenopathy.          Assessment & Plan:  Sinusitis, treated with Levaquin and a Medrol dose pack.  Laurey Morale, MD

## 2016-03-03 DIAGNOSIS — J0100 Acute maxillary sinusitis, unspecified: Secondary | ICD-10-CM | POA: Diagnosis not present

## 2016-04-22 ENCOUNTER — Other Ambulatory Visit: Payer: Self-pay | Admitting: Family Medicine

## 2016-05-04 DIAGNOSIS — R05 Cough: Secondary | ICD-10-CM | POA: Diagnosis not present

## 2016-05-04 DIAGNOSIS — J029 Acute pharyngitis, unspecified: Secondary | ICD-10-CM | POA: Diagnosis not present

## 2016-05-05 ENCOUNTER — Telehealth: Payer: Self-pay | Admitting: Family Medicine

## 2016-05-05 NOTE — Telephone Encounter (Signed)
Per Dr. Sarajane Jews pt will need a office visit to evaluate.

## 2016-05-05 NOTE — Telephone Encounter (Signed)
° °  Pt said her son was in with the flu last week and now she has it and is asking if prednis one call be called in  for her .    [facility]

## 2016-05-10 ENCOUNTER — Other Ambulatory Visit: Payer: Self-pay | Admitting: Family Medicine

## 2016-05-16 ENCOUNTER — Ambulatory Visit (INDEPENDENT_AMBULATORY_CARE_PROVIDER_SITE_OTHER): Payer: 59 | Admitting: Family Medicine

## 2016-05-16 ENCOUNTER — Encounter: Payer: Self-pay | Admitting: Family Medicine

## 2016-05-16 VITALS — BP 109/84 | HR 80 | Temp 98.2°F | Ht 59.0 in | Wt 141.0 lb

## 2016-05-16 DIAGNOSIS — Z9109 Other allergy status, other than to drugs and biological substances: Secondary | ICD-10-CM

## 2016-05-16 DIAGNOSIS — H6981 Other specified disorders of Eustachian tube, right ear: Secondary | ICD-10-CM

## 2016-05-16 MED ORDER — METHYLPREDNISOLONE 4 MG PO TBPK: ORAL_TABLET | ORAL | 0 refills | Status: DC

## 2016-05-16 NOTE — Patient Instructions (Signed)
WE NOW OFFER   [facility] FAST TRACK!!!  SAME DAY Appointments for ACUTE CARE  Such as: Sprains, Injuries, cuts, abrasions, rashes, muscle pain, joint pain, back pain Colds, flu, sore throats, headache, allergies, cough, fever  Ear pain, sinus and eye infections Abdominal pain, nausea, vomiting, diarrhea, upset stomach Animal/insect bites  3 Easy Ways to Schedule: Walk-In Scheduling Call in scheduling Mychart Sign-up: [email or web address]         

## 2016-05-16 NOTE — Progress Notes (Signed)
Pre visit review using our clinic review tool, if applicable. No additional management support is needed unless otherwise documented below in the visit note. 

## 2016-05-16 NOTE — Progress Notes (Signed)
   Subjective:    Patient ID: Nichole Mcneil, female    DOB: 1980-11-16, 36 y.o.   MRN: [id number]  HPI Here for one week of right ear pain, stuffy head, and PND. No fever or cough. Using Mucinex D with no benefit.    Review of Systems  Constitutional: Negative.   HENT: Positive for congestion, ear pain, postnasal drip and sinus pressure. Negative for sinus pain and sore throat.   Eyes: Negative.   Respiratory: Negative.   Neurological: Negative.        Objective:   Physical Exam  Constitutional: She appears well-developed and well-nourished.  HENT:  Right Ear: External ear normal.  Left Ear: External ear normal.  Nose: Nose normal.  Mouth/Throat: Oropharynx is clear and moist.  Eyes: Conjunctivae are normal.  Neck: No thyromegaly present.  Cardiovascular: Normal rate, regular rhythm, normal heart sounds and intact distal pulses.   Pulmonary/Chest: Effort normal and breath sounds normal.  Lymphadenopathy:    She has no cervical adenopathy.          Assessment & Plan:  Eustachian tube dysfunction with allergies. Advised her to start taking Allegra daily. Add a Medrol dose pack. Recheck prn. Alysia Penna, MD

## 2016-06-20 ENCOUNTER — Ambulatory Visit: Payer: 59 | Admitting: Family Medicine

## 2016-06-21 ENCOUNTER — Ambulatory Visit: Payer: 59 | Admitting: Family Medicine

## 2016-06-22 ENCOUNTER — Ambulatory Visit (INDEPENDENT_AMBULATORY_CARE_PROVIDER_SITE_OTHER): Payer: 59 | Admitting: Family Medicine

## 2016-06-22 ENCOUNTER — Encounter: Payer: Self-pay | Admitting: Family Medicine

## 2016-06-22 VITALS — BP 126/88 | HR 79 | Temp 98.5°F | Ht 59.0 in | Wt 142.0 lb

## 2016-06-22 DIAGNOSIS — Z9109 Other allergy status, other than to drugs and biological substances: Secondary | ICD-10-CM

## 2016-06-22 MED ORDER — METHYLPREDNISOLONE 4 MG PO TBPK: ORAL_TABLET | ORAL | 0 refills | Status: DC

## 2016-06-22 NOTE — Progress Notes (Signed)
Pre visit review using our clinic review tool, if applicable. No additional management support is needed unless otherwise documented below in the visit note. 

## 2016-06-22 NOTE — Progress Notes (Signed)
   Subjective:    Patient ID: Nichole Mcneil, female    DOB: [birth year], 36 y.o.   MRN: [id number]  HPI Here for 2 weeks of stuffy head, nasal congestion, and right ear pain. No fever or ST or cough. Using Nasacort and Allegra D.    Review of Systems  Constitutional: Negative.   HENT: Positive for ear pain, postnasal drip, sinus pain and sinus pressure. Negative for sore throat.   Eyes: Positive for itching. Negative for discharge.  Respiratory: Negative.        Objective:   Physical Exam  Constitutional: She appears well-developed and well-nourished.  HENT:  Right Ear: External ear normal.  Left Ear: External ear normal.  Nose: Nose normal.  Mouth/Throat: Oropharynx is clear and moist.  Eyes: Conjunctivae are normal.  Neck: Neck supple. No thyromegaly present.  Pulmonary/Chest: Effort normal and breath sounds normal.  Lymphadenopathy:    She has no cervical adenopathy.          Assessment & Plan:  Allergies. She will continue on her current regimen, but we will add a Medrol dose pack. Recheck prn.  Alysia Penna, MD

## 2016-06-22 NOTE — Patient Instructions (Signed)
WE NOW OFFER   [facility] FAST TRACK!!!  SAME DAY Appointments for ACUTE CARE  Such as: Sprains, Injuries, cuts, abrasions, rashes, muscle pain, joint pain, back pain Colds, flu, sore throats, headache, allergies, cough, fever  Ear pain, sinus and eye infections Abdominal pain, nausea, vomiting, diarrhea, upset stomach Animal/insect bites  3 Easy Ways to Schedule: Walk-In Scheduling Call in scheduling Mychart Sign-up: [email or web address]         

## 2016-09-09 ENCOUNTER — Ambulatory Visit: Payer: Self-pay | Admitting: Obstetrics and Gynecology

## 2016-09-20 ENCOUNTER — Encounter: Payer: Self-pay | Admitting: Family Medicine

## 2016-09-20 ENCOUNTER — Ambulatory Visit (INDEPENDENT_AMBULATORY_CARE_PROVIDER_SITE_OTHER): Payer: 59 | Admitting: Family Medicine

## 2016-09-20 VITALS — BP 116/97 | HR 83 | Temp 98.2°F | Ht 59.0 in | Wt 147.0 lb

## 2016-09-20 DIAGNOSIS — J019 Acute sinusitis, unspecified: Secondary | ICD-10-CM

## 2016-09-20 MED ORDER — LEVOFLOXACIN 500 MG PO TABS: 500.0000 mg | ORAL_TABLET | Freq: Every day | ORAL | 0 refills | Status: AC

## 2016-09-20 NOTE — Patient Instructions (Signed)
WE NOW OFFER   [facility] FAST TRACK!!!  SAME DAY Appointments for ACUTE CARE  Such as: Sprains, Injuries, cuts, abrasions, rashes, muscle pain, joint pain, back pain Colds, flu, sore throats, headache, allergies, cough, fever  Ear pain, sinus and eye infections Abdominal pain, nausea, vomiting, diarrhea, upset stomach Animal/insect bites  3 Easy Ways to Schedule: Walk-In Scheduling Call in scheduling Mychart Sign-up: [email or web address]         

## 2016-09-20 NOTE — Progress Notes (Signed)
   Subjective:    Patient ID: Nichole Mcneil, female    DOB: [birth year], 36 y.o.   MRN: [id number]  HPI Here for 2 weeks of sinus pressure, PND, ST , and a dry cough. On Zyrtec D.    Review of Systems  Constitutional: Negative.   HENT: Positive for congestion, postnasal drip, sinus pain, sinus pressure and sore throat.   Eyes: Negative.   Respiratory: Positive for cough.        Objective:   Physical Exam  Constitutional: She appears well-developed and well-nourished.  HENT:  Right Ear: External ear normal.  Left Ear: External ear normal.  Nose: Nose normal.  Mouth/Throat: Oropharynx is clear and moist.  Eyes: Conjunctivae are normal.  Neck: No thyromegaly present.  Pulmonary/Chest: Effort normal and breath sounds normal. No respiratory distress. She has no wheezes. She has no rales.  Lymphadenopathy:    She has no cervical adenopathy.          Assessment & Plan:  Sinusitis, treat with Levaquin. Alysia Penna, MD

## 2016-09-23 ENCOUNTER — Encounter: Payer: Self-pay | Admitting: Obstetrics and Gynecology

## 2016-09-23 ENCOUNTER — Ambulatory Visit (INDEPENDENT_AMBULATORY_CARE_PROVIDER_SITE_OTHER): Payer: 59 | Admitting: Obstetrics and Gynecology

## 2016-09-23 VITALS — BP 118/70 | Ht 59.0 in | Wt 150.0 lb

## 2016-09-23 DIAGNOSIS — Z1389 Encounter for screening for other disorder: Secondary | ICD-10-CM | POA: Diagnosis not present

## 2016-09-23 DIAGNOSIS — Z124 Encounter for screening for malignant neoplasm of cervix: Secondary | ICD-10-CM

## 2016-09-23 DIAGNOSIS — N914 Secondary oligomenorrhea: Secondary | ICD-10-CM

## 2016-09-23 DIAGNOSIS — K5900 Constipation, unspecified: Secondary | ICD-10-CM

## 2016-09-23 DIAGNOSIS — Z1329 Encounter for screening for other suspected endocrine disorder: Secondary | ICD-10-CM | POA: Diagnosis not present

## 2016-09-23 DIAGNOSIS — Z01419 Encounter for gynecological examination (general) (routine) without abnormal findings: Secondary | ICD-10-CM

## 2016-09-23 DIAGNOSIS — Z1331 Encounter for screening for depression: Secondary | ICD-10-CM

## 2016-09-23 DIAGNOSIS — Z1339 Encounter for screening examination for other mental health and behavioral disorders: Secondary | ICD-10-CM

## 2016-09-23 DIAGNOSIS — N915 Oligomenorrhea, unspecified: Secondary | ICD-10-CM | POA: Insufficient documentation

## 2016-09-23 MED ORDER — MEDROXYPROGESTERONE ACETATE 10 MG PO TABS: 10.0000 mg | ORAL_TABLET | Freq: Every day | ORAL | 5 refills | Status: DC

## 2016-09-23 NOTE — Progress Notes (Signed)
Gynecology Annual Exam  PCP: Laurey Morale, MD   Chief Complaint  Patient presents with  . Annual Exam   History of Present Illness:  Ms. Labrisha Wuellner is a 36 y.o. G1P1001 who LMP was Patient's last menstrual period was 08/26/2016., presents today for her annual examination.  Her menses come every 3 months.  She has symptoms of bloating and cramping as her menses near.  Currently, her menses last 2-3 days and are not heavy.  She denies cramping.  She is not taking hormonal medication.  She notes that she is constipated.  She currently has a bowel movement every 2-3 weeks.    She is single partner, contraception - tubal ligation.  Last Pap: 2 years ago - normal, HPV negative Hx of STDs: none  Last mammogram: n/a There is no FH of breast cancer. There is no FH of ovarian cancer. The patient does not do self-breast exams.  Tobacco use: The patient denies current or previous tobacco use. Alcohol use: social drinker Exercise: moderately active  The patient wears seatbelts: yes.      Past Medical History:  Diagnosis Date  . Charcot Marie Tooth muscular atrophy   . Migraines   . Muscular dystrophy ([place])    at age 50   . Muscular dystrophy ([place])    Past surgical history: denies     Medication Sig Start Date End Date Taking? Authorizing Provider  levofloxacin (LEVAQUIN) 500 MG tablet Take 1 tablet (500 mg total) by mouth daily. 09/20/16 09/30/16  Laurey Morale, MD  SUMAtriptan (IMITREX) 100 MG tablet take 1 tablet by mouth every 2 hours if needed 05/11/16   Laurey Morale, MD    Allergies  Allergen Reactions  . Erythromycin   . Metronidazole Other (See Comments)    May aggravate neuropathy  . Nitrofurantoin     May aggravate neuropathy  . Sulfa Antibiotics     Gynecologic History: Patient's last menstrual period was 08/26/2016.  Obstetric History: G1P1001  Social History   Social History  . Marital status: Married    Spouse name: N/A  . Number of children:  N/A  . Years of education: N/A   Occupational History  . Not on file.   Social History Main Topics  . Smoking status: Never Smoker  . Smokeless tobacco: Never Used  . Alcohol use 1.2 oz/week    2 Glasses of wine per week     Comment: occ  . Drug use: No  . Sexual activity: Yes    Birth control/ protection: Surgical   Other Topics Concern  . Not on file   Social History Narrative  . No narrative on file    Family History  Problem Relation Age of Onset  . Muscular dystrophy Mother   . Hyperlipidemia Mother   . Endometriosis Mother   . Migraines Father   . Nephrolithiasis Father   . Hypertension Father   . Arthritis Brother   . Aneurysm Maternal Uncle   . Stroke Maternal Grandmother   . Aneurysm Maternal Grandfather   . Migraines Paternal Grandmother   . Cancer Paternal Grandfather        lung    Review of Systems  Constitutional: Negative.   HENT: Negative.   Eyes: Negative.   Respiratory: Negative.   Cardiovascular: Negative.   Gastrointestinal: Positive for constipation. Negative for abdominal pain, blood in stool, diarrhea, heartburn, melena, nausea and vomiting.  Genitourinary: Negative.   Musculoskeletal: Negative.   Skin: Negative.  Neurological: Negative.   Psychiatric/Behavioral: Negative.      Physical Exam BP 118/70   Ht 4\' 11"  (1.499 m)   Wt 150 lb (68 kg)   LMP 08/26/2016   BMI 30.30 kg/m    Physical Exam  Constitutional: She is oriented to person, place, and time. She appears well-developed and well-nourished. No distress.  Genitourinary: Vagina normal and uterus normal. Pelvic exam was performed with patient supine. There is no rash, tenderness or lesion on the right labia. There is no rash, tenderness or lesion on the left labia. Vagina exhibits no lesion. No tenderness or bleeding in the vagina. No signs of injury around the vagina. Right adnexum does not display mass, does not display tenderness and does not display fullness. Left  adnexum does not display mass, does not display tenderness and does not display fullness. Cervix does not exhibit motion tenderness, lesion, discharge or polyp.   Uterus is mobile and anteverted. Uterus is not enlarged, tender or exhibiting a mass.  HENT:  Head: Normocephalic and atraumatic.  Eyes: EOM are normal. No scleral icterus.  Neck: Normal range of motion. Neck supple. No thyromegaly present.  Cardiovascular: Normal rate and regular rhythm.  Exam reveals no gallop and no friction rub.   No murmur heard. Pulmonary/Chest: Effort normal and breath sounds normal. No respiratory distress. She has no wheezes. She has no rales.  Abdominal: Soft. Bowel sounds are normal. She exhibits no distension and no mass. There is no tenderness. There is no rebound and no guarding.  Musculoskeletal: Normal range of motion. She exhibits no edema.  Lymphadenopathy:    She has no cervical adenopathy.  Neurological: She is alert and oriented to person, place, and time. No cranial nerve deficit.  Skin: Skin is warm and dry. No erythema.  Psychiatric: She has a normal mood and affect. Her behavior is normal. Judgment normal.   Female chaperone present for pelvic and breast  portions of the physical exam  Results: AUDIT Questionnaire (screen for alcoholism): 2 PHQ-9: 7   Assessment: 36 y.o. G68P1001 female here for routine annual gynecologic examination, oligomenorrhea.   Plan: Problem List Items Addressed This Visit    Constipation   Relevant Orders   TSH + free T4   Oligomenorrhea   Relevant Medications   medroxyPROGESTERone (PROVERA) 10 MG tablet    Other Visit Diagnoses    Women's annual routine gynecological examination    -  Primary   Relevant Orders   TSH + free T4   IGP, Aptima HPV, rfx 16/18,45   Screening for alcohol problem       Screening for depression       Screening for thyroid disorder       Relevant Orders   TSH + free T4   Pap smear for cervical cancer screening        Relevant Orders   IGP, Aptima HPV, rfx 16/18,45      Screening: -- Blood pressure screen normal -- Colonoscopy - not due -- Mammogram - not due -- Weight screening: normal -- Depression screening negative (PHQ-9) -- Nutrition: normal -- cholesterol screening: not due for screening -- osteoporosis screening: not due -- tobacco screening: not using -- alcohol screening: AUDIT questionnaire indicates low-risk usage. -- family history of breast cancer screening: done. not at high risk. -- no evidence of domestic violence or intimate partner violence. -- STD screening: gonorrhea/chlamydia NAAT not collected per patient request. -- pap smear collected per ASCCP guidelines  Oligomenorrhea: Will try to provoke  more frequent and regular menses with monthly provera.  Will follow up if no response.  Spent 15 minutes in counseling and management of her oligomenorrhea.    Prentice Docker, MD 09/26/2016 9:32 PM

## 2016-09-26 DIAGNOSIS — Z124 Encounter for screening for malignant neoplasm of cervix: Secondary | ICD-10-CM | POA: Diagnosis not present

## 2016-09-26 DIAGNOSIS — Z01419 Encounter for gynecological examination (general) (routine) without abnormal findings: Secondary | ICD-10-CM | POA: Diagnosis not present

## 2016-09-29 ENCOUNTER — Encounter: Payer: Self-pay | Admitting: Obstetrics and Gynecology

## 2016-09-29 LAB — IGP, APTIMA HPV, RFX 16/18,45
HPV Aptima: NEGATIVE
PAP Smear Comment: 0

## 2016-09-30 ENCOUNTER — Other Ambulatory Visit: Payer: 59

## 2016-10-17 ENCOUNTER — Ambulatory Visit: Payer: Self-pay | Admitting: Podiatry

## 2016-11-08 ENCOUNTER — Ambulatory Visit: Payer: Self-pay | Admitting: Podiatry

## 2016-11-10 ENCOUNTER — Encounter: Payer: Self-pay | Admitting: Family Medicine

## 2016-11-25 ENCOUNTER — Ambulatory Visit: Payer: Self-pay | Admitting: Podiatry

## 2016-12-09 ENCOUNTER — Other Ambulatory Visit: Payer: Self-pay | Admitting: Podiatry

## 2016-12-09 ENCOUNTER — Ambulatory Visit (INDEPENDENT_AMBULATORY_CARE_PROVIDER_SITE_OTHER): Payer: 59 | Admitting: Podiatry

## 2016-12-09 ENCOUNTER — Ambulatory Visit (INDEPENDENT_AMBULATORY_CARE_PROVIDER_SITE_OTHER): Payer: 59

## 2016-12-09 DIAGNOSIS — M7672 Peroneal tendinitis, left leg: Secondary | ICD-10-CM | POA: Diagnosis not present

## 2016-12-09 DIAGNOSIS — G600 Hereditary motor and sensory neuropathy: Secondary | ICD-10-CM

## 2016-12-09 DIAGNOSIS — M25572 Pain in left ankle and joints of left foot: Secondary | ICD-10-CM

## 2016-12-09 DIAGNOSIS — M79672 Pain in left foot: Secondary | ICD-10-CM

## 2016-12-09 MED ORDER — BETAMETHASONE SOD PHOS & ACET 6 (3-3) MG/ML IJ SUSP: 3.0000 mg | Freq: Once | INTRAMUSCULAR | Status: DC

## 2016-12-09 NOTE — Progress Notes (Signed)
Patient ID: Nichole Mcneil, female   DOB: [birth year], 36 y.o.   MRN: [id number]   HPI: 36 year old female with a history of known Charcot-Marie-Tooth muscular atrophy presents today for evaluation of left foot pain. Patient believe she is walking on the outside of her left foot which is causing pain and tenderness. Pain is been going on for approximately 2 months now. Patient was diagnosed with CMT when she was 36 years old. She has gone through multiple modalities conservatively including custom orthotics, physical therapy, immobilization casting. She presents today for further treatment and evaluation  Past Medical History:  Diagnosis Date  . Charcot Marie Tooth muscular atrophy   . Migraines   . Muscular dystrophy ([place])    at age 28   . Muscular dystrophy [facility])      Physical Exam: General: The patient is alert and oriented x3 in no acute distress.  Dermatology: Skin is warm, dry and supple bilateral lower extremities. Negative for open lesions or macerations.  Vascular: Palpable pedal pulses bilaterally. No edema or erythema noted. Capillary refill within normal limits.  Neurological: Epicritic and protective threshold grossly intact bilaterally.   Musculoskeletal Exam: Loss of intrinsic muscular atrophy with increased longitudinal arches bilateral. Limited range of motion noted with the subtalar joint and rear foot inversion also noted. Muscle strength limited to the peroneal muscle group. There is also pain on palpation noted along the peroneal tendon sheath of the left lower extremity consistent with a peroneal tendinitis  Radiographic Exam:  Normal osseous mineralization. Joint spaces preserved. No fracture/dislocation/boney destruction.    Assessment: -  Charcot-Marie-Tooth muscle atrophy bilateral lower extremities - Peroneal tendinitis left lower extremity-    Plan of Care:  - Patient was evaluated today. X-rays reviewed today. - Injection of 0.5 mL Celestone Soluspan  injected in the patient's peroneal tendon sheath left lower extremity - Recommended the patient have an appointment with Liliane Channel, Pedorthist, for bracing and orthotic options to rebalance her foot to alleviate pain and pressure from the outside aspect of her foot. -  Return to clinic when necessary   Edrick Kins, DPM [facility]  Dr. Edrick Kins, DPM    [address], [place] [address]                Office [phone]  Fax [phone]

## 2016-12-09 NOTE — Progress Notes (Signed)
   Subjective:    Patient ID: Nichole Mcneil, female    DOB: [birth year], 36 y.o.   MRN: [id number]  HPI    Review of Systems     Objective:   Physical Exam        Assessment & Plan:

## 2016-12-12 ENCOUNTER — Encounter: Payer: Self-pay | Admitting: Family Medicine

## 2016-12-12 ENCOUNTER — Ambulatory Visit (INDEPENDENT_AMBULATORY_CARE_PROVIDER_SITE_OTHER): Payer: 59 | Admitting: Family Medicine

## 2016-12-12 VITALS — BP 108/78 | HR 82 | Temp 97.9°F | Wt 148.0 lb

## 2016-12-12 DIAGNOSIS — J018 Other acute sinusitis: Secondary | ICD-10-CM

## 2016-12-12 MED ORDER — LEVOFLOXACIN 500 MG PO TABS: 500.0000 mg | ORAL_TABLET | Freq: Every day | ORAL | 0 refills | Status: AC

## 2016-12-12 NOTE — Progress Notes (Signed)
   Subjective:    Patient ID: Nichole Mcneil, female    DOB: 1981-02-13, 36 y.o.   MRN: [id number]  HPI Here for one week of sinus pressure, PND, and blowing yellow mucus from the nose. No cough. On Mucinex.    Review of Systems  Constitutional: Negative.   HENT: Positive for congestion, postnasal drip and sinus pressure. Negative for facial swelling, sinus pain and sore throat.   Eyes: Negative.   Respiratory: Negative.        Objective:   Physical Exam  Constitutional: She appears well-developed and well-nourished.  HENT:  Right Ear: External ear normal.  Left Ear: External ear normal.  Nose: Nose normal.  Mouth/Throat: Oropharynx is clear and moist.  Eyes: Conjunctivae are normal.  Neck: No thyromegaly present.  Pulmonary/Chest: Effort normal and breath sounds normal. No respiratory distress. She has no wheezes. She has no rales.  Lymphadenopathy:    She has no cervical adenopathy.          Assessment & Plan:  Sinusitis, treat with Levaquin. Alysia Penna, MD

## 2016-12-13 ENCOUNTER — Telehealth: Payer: Self-pay | Admitting: Family Medicine

## 2016-12-13 ENCOUNTER — Telehealth: Payer: Self-pay | Admitting: Podiatry

## 2016-12-13 NOTE — Telephone Encounter (Signed)
I saw Dr. Amalia Hailey on Friday 19 October. I received an injection in my foot that starts with an S but I cannot pronounce it. Both my PCP and myself think I've had some side effects from the medication. If you could please give me a cal back at [phone].

## 2016-12-13 NOTE — Telephone Encounter (Signed)
Pt seen yesterday and wants to know if Dr Sarajane Jews will call her in prednisone to open her head up.    [place], [place]

## 2016-12-13 NOTE — Telephone Encounter (Signed)
Call in a Medrol dose pack  

## 2016-12-13 NOTE — Telephone Encounter (Signed)
Returned patient call and informed her that Celestone was the injection given to her and informed her of possible side effects.  After giving her side effects, she stated that she did not think her sinus infection was coming from injection.

## 2016-12-14 MED ORDER — METHYLPREDNISOLONE 4 MG PO TBPK: ORAL_TABLET | ORAL | 0 refills | Status: DC

## 2016-12-14 NOTE — Telephone Encounter (Signed)
I sent script e-scribe to [facility] and spoke with pt.  

## 2016-12-20 ENCOUNTER — Other Ambulatory Visit: Payer: 59 | Admitting: Orthotics

## 2016-12-26 ENCOUNTER — Other Ambulatory Visit: Payer: 59 | Admitting: Orthotics

## 2017-01-23 ENCOUNTER — Ambulatory Visit: Payer: Self-pay | Admitting: *Deleted

## 2017-01-23 ENCOUNTER — Ambulatory Visit: Payer: 59 | Admitting: Family Medicine

## 2017-01-23 ENCOUNTER — Ambulatory Visit (INDEPENDENT_AMBULATORY_CARE_PROVIDER_SITE_OTHER)
Admission: RE | Admit: 2017-01-23 | Discharge: 2017-01-23 | Disposition: A | Discharge status: Home / Self Care | Payer: 59 | Source: Ambulatory Visit | Attending: Family Medicine | Admitting: Family Medicine

## 2017-01-23 ENCOUNTER — Encounter: Payer: Self-pay | Admitting: Family Medicine

## 2017-01-23 VITALS — BP 138/86 | HR 94 | Temp 98.1°F | Ht 59.0 in | Wt 145.8 lb

## 2017-01-23 DIAGNOSIS — R0602 Shortness of breath: Secondary | ICD-10-CM | POA: Diagnosis not present

## 2017-01-23 DIAGNOSIS — R030 Elevated blood-pressure reading, without diagnosis of hypertension: Secondary | ICD-10-CM | POA: Diagnosis not present

## 2017-01-23 DIAGNOSIS — F439 Reaction to severe stress, unspecified: Secondary | ICD-10-CM

## 2017-01-23 DIAGNOSIS — R0789 Other chest pain: Secondary | ICD-10-CM | POA: Diagnosis not present

## 2017-01-23 DIAGNOSIS — K219 Gastro-esophageal reflux disease without esophagitis: Secondary | ICD-10-CM | POA: Diagnosis not present

## 2017-01-23 DIAGNOSIS — R079 Chest pain, unspecified: Secondary | ICD-10-CM | POA: Diagnosis not present

## 2017-01-23 NOTE — Addendum Note (Signed)
Addended by: Dorrene German on: 01/23/2017 12:55 PM   Modules accepted: Orders

## 2017-01-23 NOTE — Patient Instructions (Signed)
BEFORE YOU LEAVE: -X-ray sheet -follow up: 2 weeks with PCP  Please get the x-ray.  Please call to schedule counseling for stress.  Please start Nexium, over-the-counter dose once daily for the next 2 weeks.  -We placed a referral for you as discussed for the heart test. It usually takes about 1-2 weeks to process and schedule this referral. If you have not heard from [place] regarding this appointment in 2 weeks please contact our office.  I hope you are feeling better soon! Seek care promptly if your symptoms worsen, new concerns arise or you are not improving with treatment.

## 2017-01-23 NOTE — Telephone Encounter (Signed)
Confirmed pt is on schedule for today 01/23/2017 with Dr. Maudie Mercury.

## 2017-01-23 NOTE — Progress Notes (Signed)
HPI:  Acute visit for atypical chest pain: -Constant for the last 3 days, mild comfort in the upper substernal chest region, sharp at times, improved with over-the-counter acid medications  -this has really stressed her out as she had a cousin who had heart disease at a young age -she has been checking her blood pressure and it has been a little elevated in the 130s-140s -No immediate family members with heart disease, has a grandparent with heart disease -early disease -Parents have hypertension -She has been under a lot of stress, her 34-year-old struggles with asthma and also is going to get his third dental surgery she has been struggling with insurance issues and also stress related to this over the last week, she has felt anxious and stressed out -Denies depression, exertional chest pain, shortness of breath, palpitations, swelling, dysphasia, vomiting, shortness of breath -Cold last week and use some over-the-counter cold medications at the time, currently not taking any cold medications or NSAIDs -Does drink wine a few nights per week -Recently started exercising and eating healthier,no CP or SOB with exercise, denies history of cholesterol or diabetes problems or elevated blood pressure in the past   ROS: See pertinent positives and negatives per HPI.  Past Medical History:  Diagnosis Date  . Charcot Marie Tooth muscular atrophy   . Migraines   . Muscular dystrophy    at age 44   . Muscular dystrophy     History reviewed. No pertinent surgical history.  Family History  Problem Relation Age of Onset  . Muscular dystrophy Mother   . Hyperlipidemia Mother   . Endometriosis Mother   . Migraines Father   . Nephrolithiasis Father   . Hypertension Father   . Arthritis Brother   . Aneurysm Maternal Uncle   . Stroke Maternal Grandmother   . Aneurysm Maternal Grandfather   . Migraines Paternal Grandmother   . Cancer Paternal Grandfather        lung    Social History    Socioeconomic History  . Marital status: Married    Spouse name: None  . Number of children: None  . Years of education: None  . Highest education level: None  Social Needs  . Financial resource strain: None  . Food insecurity - worry: None  . Food insecurity - inability: None  . Transportation needs - medical: None  . Transportation needs - non-medical: None  Occupational History  . None  Tobacco Use  . Smoking status: Never Smoker  . Smokeless tobacco: Never Used  Substance and Sexual Activity  . Alcohol use: Yes    Alcohol/week: 1.2 oz    Types: 2 Glasses of wine per week    Comment: occ  . Drug use: No  . Sexual activity: Yes    Birth control/protection: Surgical  Other Topics Concern  . None  Social History Narrative  . None     Current Outpatient Medications:  .  SUMAtriptan (IMITREX) 100 MG tablet, Take 100 mg by mouth every 2 (two) hours as needed for migraine. May repeat in 2 hours if headache persists or recurs., Disp: , Rfl:   Current Facility-Administered Medications:  .  betamethasone acetate-betamethasone sodium phosphate (CELESTONE) injection 3 mg, 3 mg, Intramuscular, Once, Evans, Ruby Cola M, DPM  EXAM:  Vitals:   01/23/17 1116  BP: 138/86  Pulse: 94  Temp: 98.1 F (36.7 C)  SpO2: 99%    Body mass index is 29.45 kg/m.  GENERAL: vitals reviewed and listed above, alert, oriented,  appears well hydrated and in no acute distress  HEENT: atraumatic, conjunttiva clear, no obvious abnormalities on inspection of external nose and ears  NECK: no obvious masses on inspection  LUNGS: clear to auscultation bilaterally, no wheezes, rales or rhonchi, good air movement  CV: HRRR, no peripheral edema  ABD: Soft, nontender to palpation  MS: moves all extremities without noticeable abnormality, no chest wall tenderness to palpation  PSYCH: pleasant and cooperative, no obvious depression or anxiety  ASSESSMENT AND PLAN:  Discussed the following  assessment and plan:  Atypical chest pain - Plan: EKG 12-Lead  Stress  Gastroesophageal reflux disease, esophagitis presence not specified  Elevated BP without diagnosis of hypertension  -we discussed possible serious and likely etiologies, workup and treatment, treatment risks and return precautions - suspect possible GERD/stress combination -EKG w/ NSR -after this discussion, Kyliyah opted for: CXR, trial nexium OTC, advised CBT for stress, follow up in 2 weeks with PCP for this and recheck BP -mildly elevated BP, likely from anxiety but advised may need antihypertensive if remains elevated -of course, we advised Amila  to return or notify a doctor immediately if symptoms worsen or persist or new concerns arise.  -declined flu shot today   Patient Instructions  BEFORE YOU LEAVE: -X-ray sheet -follow up: 2 weeks with PCP  Please get the x-ray.  Please call to schedule counseling for stress.  Please start Nexium, over-the-counter dose once daily for the next 2 weeks.  -We placed a referral for you as discussed for the heart test. It usually takes about 1-2 weeks to process and schedule this referral. If you have not heard from [place] regarding this appointment in 2 weeks please contact our office.  I hope you are feeling better soon! Seek care promptly if your symptoms worsen, new concerns arise or you are not improving with treatment.        Colin Benton R., DO

## 2017-01-23 NOTE — Telephone Encounter (Signed)
Pt started  experiencing chest pain and tightness on Saturday 01/21/17; today she is still experiencing it and she had her BP checked at work today 138/94; measurement was taken on left arm with a manual cuff; pt states her BP is normally 119/80; based on nurse triage, pt directed to go to ED but she prefers to see a physician instead; pt is normally seen by Dr Sarajane Jews but no appointments available today; pt scheduled with Dr Colin Benton at [phone]; will route to [facility] pool for notification of this upcoming appointment  Reason for Disposition . Difficulty breathing  Answer Assessment - Initial Assessment Questions 1. LOCATION: "Where does it hurt?"       Center above breasts 2. RADIATION: "Does the pain go anywhere else?" (e.g., into neck, jaw, arms, back)     Into neck 3. ONSET: "When did the chest pain begin?" (Minutes, hours or days)      Saturday December 1 4. PATTERN "Does the pain come and go, or has it been constant since it started?"  "Does it get worse with exertion?"      Comes and goes; pt does not notice anything that worsens it  5. DURATION: "How long does it last" (e.g., seconds, minutes, hours)     hours 6. SEVERITY: "How bad is the pain?"  (e.g., Scale 1-10; mild, moderate, or severe)    - MILD (1-3): doesn't interfere with normal activities     - MODERATE (4-7): interferes with normal activities or awakens from sleep    - SEVERE (8-10): excruciating pain, unable to do any normal activities       5 out of 10; pt describes it as "it is noticeable" 7. CARDIAC RISK FACTORS: "Do you have any history of heart problems or risk factors for heart disease?" (e.g., prior heart attack, angina; high blood pressure, diabetes, being overweight, high cholesterol, smoking, or strong family history of heart disease)     no 8. PULMONARY RISK FACTORS: "Do you have any history of lung disease?"  (e.g., blood clots in lung, asthma, emphysema, birth control pills)     no 9. CAUSE: "What do you think  is causing the chest pain?"     no 10. OTHER SYMPTOMS: "Do you have any other symptoms?" (e.g., dizziness, nausea, vomiting, sweating, fever, difficulty breathing, cough)       dizzness and shortness of breath 11. PREGNANCY: "Is there any chance you are pregnant?" "When was your last menstrual period?"       No LMP end of September, 1st of October; pt states that she is never regular; no contraception used  Protocols used: CHEST PAIN-A-AH

## 2017-01-27 ENCOUNTER — Telehealth: Payer: Self-pay | Admitting: Family Medicine

## 2017-01-27 NOTE — Telephone Encounter (Signed)
I spoke with pt and went over results. 

## 2017-01-27 NOTE — Telephone Encounter (Signed)
Copied from [place] [id number]. Topic: Quick Communication - Other Results >> Jan 27, 2017 11:13 AM Burnis Medin, NT wrote: Pt. Is calling to see if the results were back in from her chest x ray. Pt would like a call back

## 2017-02-06 ENCOUNTER — Encounter: Payer: Self-pay | Admitting: Family Medicine

## 2017-02-06 ENCOUNTER — Ambulatory Visit: Payer: 59 | Admitting: Family Medicine

## 2017-02-06 VITALS — BP 120/88 | HR 84 | Temp 97.8°F | Wt 144.8 lb

## 2017-02-06 DIAGNOSIS — M940 Chondrocostal junction syndrome [Tietze]: Secondary | ICD-10-CM | POA: Diagnosis not present

## 2017-02-06 NOTE — Progress Notes (Signed)
   Subjective:    Patient ID: Nichole Mcneil, female    DOB: November 30, 1980, 36 y.o.   MRN: [id number]  HPI Here to follow up on chest pain. This started about 2 weeks ago. It comes and goes, it is sharp in quality, and it is centered over the sternum. Sometimes she has sharp pains in the center of the back as well. Taking a deep breath can make the pain worse. It is not related to exertion. No cough or SOB. She was here on 01-23-17 and she had a normal exam, a normal EKG, and a normal CXR. We reviewed all these records together today.  It was felt she had some GERD and she was advised to take something like Nexium. She has not done so however and th pain persists.    Review of Systems  Constitutional: Negative.   HENT: Negative.   Eyes: Negative.   Respiratory: Negative.   Cardiovascular: Positive for chest pain. Negative for palpitations and leg swelling.  Gastrointestinal: Negative.   Neurological: Negative.        Objective:   Physical Exam  Constitutional: She is oriented to person, place, and time. She appears well-developed and well-nourished. No distress.  Cardiovascular: Normal rate, regular rhythm, normal heart sounds and intact distal pulses.  Pulmonary/Chest: Breath sounds normal. No respiratory distress. She has no wheezes. She has no rales.  She is tender along both sternal margins   Abdominal: Soft. Bowel sounds are normal. She exhibits no distension and no mass. There is no tenderness. There is no rebound and no guarding.  Neurological: She is alert and oriented to person, place, and time.          Assessment & Plan:  Costochondritis , treat with Aleve bid. This should resolve quickly. Recheck prn.  Alysia Penna, MD

## 2017-04-18 ENCOUNTER — Ambulatory Visit: Payer: 59 | Admitting: Podiatry

## 2017-04-18 ENCOUNTER — Ambulatory Visit (INDEPENDENT_AMBULATORY_CARE_PROVIDER_SITE_OTHER): Payer: 59

## 2017-04-18 ENCOUNTER — Encounter: Payer: Self-pay | Admitting: Podiatry

## 2017-04-18 DIAGNOSIS — M84375A Stress fracture, left foot, initial encounter for fracture: Secondary | ICD-10-CM | POA: Diagnosis not present

## 2017-04-18 DIAGNOSIS — G600 Hereditary motor and sensory neuropathy: Secondary | ICD-10-CM | POA: Diagnosis not present

## 2017-04-18 MED ORDER — MELOXICAM 15 MG PO TABS
15.0000 mg | ORAL_TABLET | Freq: Every day | ORAL | 1 refills | Status: AC
Start: 2017-04-18 — End: 2017-05-18

## 2017-04-18 NOTE — Progress Notes (Signed)
Patient ID: Nichole Mcneil, female   DOB: 1980-10-15, 37 y.o.   MRN: [id number]   HPI: 37 year old female with a history of known Charcot-Marie-Tooth muscular atrophy presents today for evaluation of left foot pain.  Patient states that she has been experiencing left foot pain on the lateral side and across the top of the foot for the past week.  She is concerned for stress fracture because she has had stress fractures in the past due to her Charcot-Marie-Tooth and excessive cavus deformity.  There is also some bruising across the top of the toes.  Patient states that it is extremely painful after walking or standing for long periods of time.  She denies any significant trauma.  She has been icing and using ice and over-the-counter Aleve for some minimal alleviation of symptoms.  Past Medical History:  Diagnosis Date  . Charcot Marie Tooth muscular atrophy   . Migraines   . Muscular dystrophy    at age 70   . Muscular dystrophy      Physical Exam: General: The patient is alert and oriented x3 in no acute distress.  Dermatology: Skin is warm, dry and supple bilateral lower extremities. Negative for open lesions or macerations.  Vascular: Palpable pedal pulses bilaterally. No edema or erythema noted. Capillary refill within normal limits.  Neurological: Epicritic and protective threshold grossly intact bilaterally.   Musculoskeletal Exam: Loss of intrinsic muscular atrophy with increased longitudinal arches bilateral. Limited range of motion noted with the subtalar joint and rear foot inversion also noted. Muscle strength limited to the peroneal muscle group.  Consistent with a likely stress fracture.  Significant amount of pain on palpation overlying the fourth and fifth metatarsals of the left foot  Radiographic Exam:  Normal osseous mineralization. Joint spaces preserved. No fracture/dislocation/boney destruction.  No evidence of an obvious stress fracture however clinical exam  indicates otherwise.  Assessment: -  Charcot-Marie-Tooth muscle atrophy bilateral lower extremities - Peroneal tendinitis left lower extremity -Clinical evidence of stress fracture left foot   Plan of Care:  - Patient was evaluated today. X-rays reviewed today. -The patient was unable to meet with Liliane Channel, Pedorthist for orthotics.  She states that insurance would not cover the orthotics and she cannot afford them financially at the moment. -Prescription for meloxicam 15 mg daily -Recommend that the patient wear good supportive shoe gear.  At the moment wearing an immobilization cam boot is not an option.  The patient would rather wear good supportive shoes.  I discussed reducing her activity as much as possible to alleviate pressure from the stress fractures -Return to clinic as needed  Edrick Kins, DPM [facility]  Dr. Edrick Kins, DPM    [address], [place] [address]                Office [phone]  Fax [phone]

## 2017-05-09 ENCOUNTER — Telehealth: Payer: Self-pay | Admitting: Family Medicine

## 2017-05-09 NOTE — Telephone Encounter (Signed)
Patient will need to be contacted to discuss allergy symptoms.

## 2017-05-09 NOTE — Telephone Encounter (Signed)
Copied from [place]. Topic: Quick Communication - See Telephone Encounter >> May 09, 2017  1:43 PM Vernona Rieger wrote: CRM for notification. See Telephone encounter for:   05/09/17.  Patient is requesting predniSONE (STERAPRED UNI-PAK 21 TAB) 5 MG (21) TBPK tablet [[id number]] DISCONTINUED for her allergies  [id number] - [place], [place] - [place]

## 2017-05-10 NOTE — Telephone Encounter (Signed)
Called patient to talk about her allergy symptoms. Pt stated that she had felt a little stuffy yesterday and felt like she need prednisone. But she went home and took Mucinex and Sudafed and that really helped her. She states that she feels fine today and will call back if needed.   Provider: S. Sarajane Jews, MD

## 2017-07-03 ENCOUNTER — Ambulatory Visit: Payer: 59 | Admitting: Podiatry

## 2017-07-03 ENCOUNTER — Ambulatory Visit (INDEPENDENT_AMBULATORY_CARE_PROVIDER_SITE_OTHER): Payer: 59

## 2017-07-03 DIAGNOSIS — S90112A Contusion of left great toe without damage to nail, initial encounter: Secondary | ICD-10-CM

## 2017-07-03 DIAGNOSIS — M84375A Stress fracture, left foot, initial encounter for fracture: Secondary | ICD-10-CM

## 2017-07-05 NOTE — Progress Notes (Signed)
Patient ID: Nichole Mcneil, female   DOB: 08-25-1980, 37 y.o.   MRN: [id number]   HPI: 37 year old female with a history of known Charcot-Marie-Tooth muscular atrophy presents today for evaluation of left foot pain. She states her left hallux may be fractured secondary to getting it caught in a door and bending it backwards a few days ago. Walking and trying to move the toe increases the pain. She has not done anything for treatment. Patient is here for further evaluation and treatment.   Past Medical History:  Diagnosis Date  . Charcot Marie Tooth muscular atrophy   . Migraines   . Muscular dystrophy    at age 54   . Muscular dystrophy      Physical Exam: General: The patient is alert and oriented x3 in no acute distress.  Dermatology: Skin is warm, dry and supple bilateral lower extremities. Negative for open lesions or macerations.  Vascular: Palpable pedal pulses bilaterally. No erythema noted. Capillary refill within normal limits.  Neurological: Epicritic and protective threshold grossly intact bilaterally.   Musculoskeletal Exam: Loss of intrinsic muscular atrophy with increased longitudinal arches bilateral. Limited range of motion noted with the subtalar joint and rear foot inversion also noted. Muscle strength limited to the peroneal muscle group.   Ecchymosis, edema and pain with palpation noted to the left hallux.   Radiographic Exam:  Normal osseous mineralization. Joint spaces preserved. No fracture/dislocation/boney destruction.  No evidence of an obvious stress fracture however clinical exam indicates otherwise.  Assessment: 1. 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 Peroneal tendinitis left lower extremity 3. Left hallux contusion    Plan of Care:  1. Patient was evaluated today. X-rays reviewed today. 2. Recommended OTC Motrin 600 mg twice daily for pain and swelling.  3. Recommended OTC ankle brace.  4. Return to clinic as  needed.   Edrick Kins, DPM [facility]  Dr. Edrick Kins, DPM    [address], [place] [address]                Office [phone]  Fax [phone]

## 2017-07-21 ENCOUNTER — Encounter: Payer: Self-pay | Admitting: Obstetrics and Gynecology

## 2017-07-21 ENCOUNTER — Ambulatory Visit: Payer: 59 | Admitting: Obstetrics and Gynecology

## 2017-07-21 VITALS — BP 120/88 | HR 104 | Ht 59.0 in | Wt 148.0 lb

## 2017-07-21 DIAGNOSIS — B9689 Other specified bacterial agents as the cause of diseases classified elsewhere: Secondary | ICD-10-CM

## 2017-07-21 DIAGNOSIS — N760 Acute vaginitis: Secondary | ICD-10-CM | POA: Diagnosis not present

## 2017-07-21 DIAGNOSIS — N941 Unspecified dyspareunia: Secondary | ICD-10-CM | POA: Diagnosis not present

## 2017-07-21 DIAGNOSIS — R102 Pelvic and perineal pain: Secondary | ICD-10-CM

## 2017-07-21 MED ORDER — CLINDAMYCIN HCL 300 MG PO CAPS: 300.0000 mg | ORAL_CAPSULE | Freq: Two times a day (BID) | ORAL | 0 refills | Status: AC

## 2017-07-21 NOTE — Progress Notes (Signed)
Patient ID: Nichole Mcneil, female   DOB: 1981-01-01, 37 y.o.   MRN: [id number]  Reason for Consult: Vaginal dryness (vaginal tenderness/ raw feeling x few weeks)   Referred by Laurey Morale, MD  Subjective:     HPI:  Nichole Mcneil is a 37 y.o. female she presents today with complaints of vaginal dryness and irritation.  She states that it feels like her vagina is raw both on the outside and inside.  She reports that she has been having a discharge that is brown blood.  She she reports that her last menstrual period started on May 13 or 14.  She reports that she had a regular menses in May and in April.  She reports that previous to that she was having irregular periods every 2 to 3 months this is been a persistent problem over the last 4 years.  She also adds that normally she has a headache during her periods but last month she did not have one.  She reports that she has been having hot flashes for 3 years that have been causing sleep disturbances with her waking up at night.  She has had a tubal which was performed after the birth of her son.  She reports in 2013 she had ovarian cyst removed.  She is not sure when her mother went through menopause.  She reports that her mother had a hysterectomy at the age of 43 for endometriosis. Her seuxal pain is worse with deep penetration. She denies any troubles in her relationship that might contribute to vaginal dryness or pain.   Past Medical History:  Diagnosis Date  . Charcot Marie Tooth muscular atrophy   . Migraines   . Muscular dystrophy ([place])    at age 76   . Muscular dystrophy ([place])    Family History  Problem Relation Age of Onset  . Muscular dystrophy Mother   . Hyperlipidemia Mother   . Endometriosis Mother   . Migraines Father   . Nephrolithiasis Father   . Hypertension Father   . Arthritis Brother   . Aneurysm Maternal Uncle   . Stroke Maternal Grandmother   . Aneurysm Maternal Grandfather   . Migraines Paternal  Grandmother   . Cancer Paternal Grandfather        lung   Past Surgical History:  Procedure Laterality Date  . TUBAL LIGATION      Short Social History:  Social History   Tobacco Use  . Smoking status: Never Smoker  . Smokeless tobacco: Never Used  Substance Use Topics  . Alcohol use: Yes    Alcohol/week: 1.2 oz    Types: 2 Glasses of wine per week    Comment: occ    Allergies  Allergen Reactions  . Erythromycin   . Metronidazole Other (See Comments)    May aggravate neuropathy  . Nitrofurantoin     May aggravate neuropathy  . Sulfa Antibiotics     Current Outpatient Medications  Medication Sig Dispense Refill  . amoxicillin (AMOXIL) 500 MG tablet     . SUMAtriptan (IMITREX) 100 MG tablet Take 100 mg by mouth every 2 (two) hours as needed for migraine. May repeat in 2 hours if headache persists or recurs.    . clindamycin (CLEOCIN) 300 MG capsule Take 1 capsule (300 mg total) by mouth 2 (two) times daily for 7 days. 14 capsule 0   Current Facility-Administered Medications  Medication Dose Route Frequency Provider Last Rate Last Dose  . betamethasone acetate-betamethasone  sodium phosphate (CELESTONE) injection 3 mg  3 mg Intramuscular Once Edrick Kins, DPM        Review of Systems  Constitutional: Negative for chills, fatigue, fever and unexpected weight change.  HENT: Negative for trouble swallowing.  Eyes: Negative for loss of vision.  Respiratory: Negative for cough, shortness of breath and wheezing.  Cardiovascular: Negative for chest pain, leg swelling, palpitations and syncope.  GI: Negative for abdominal pain, blood in stool, diarrhea, nausea and vomiting.  GU: Negative for difficulty urinating, dysuria, frequency and hematuria.  Musculoskeletal: Negative for back pain, leg pain and joint pain.  Skin: Negative for rash.  Neurological: Negative for dizziness, headaches, light-headedness, numbness and seizures.  Psychiatric: Negative for behavioral  problem, confusion, depressed mood and sleep disturbance.        Objective:  Objective   Vitals:   07/21/17 0806  BP: 120/88  Pulse: (!) 104  Weight: 148 lb (67.1 kg)  Height: 4\' 11"  (1.499 m)   Body mass index is 29.89 kg/m.  Physical Exam  Constitutional: She is oriented to person, place, and time. She appears well-developed and well-nourished.  HENT:  Head: Normocephalic and atraumatic.  Eyes: EOM are normal.  Cardiovascular: Normal rate, regular rhythm and normal heart sounds.  Pulmonary/Chest: Effort normal and breath sounds normal.  Abdominal: Soft. She exhibits no distension and no mass. There is no tenderness.  Genitourinary:  Genitourinary Comments: Uterus normal anteverted, 6cm size. Tender to palpation.Negative CMT. Ovaries normal bilaterally, no pelvic fullness or masses. Scant vaginal discharge. No lesions seen. Tenderness no found with q-tip on perineum. No skin changes to vulva. Small tenderness to q-tip in vestibule.      Neurological: She is alert and oriented to person, place, and time.  Skin: Skin is warm and dry.  Psychiatric: She has a normal mood and affect. Her behavior is normal. Judgment and thought content normal.  Nursing note and vitals reviewed.  Wet Prep: Clue Cells: Positive, whiff negative Fungal elements: Negative Trichomonas: Negative      Assessment/Plan:    37 yo with vaginal irritation and pain during intercourse. Suspected bacterial vaginosis- will treat with clindamycin, nuswab sent.  Uterine tenderness and pain during intercourse-  Differential could include: adenomyosis, endometriosis, PID, vestibulitis or early menopause- will begin evaluation with treatment for vaginitis and have patient return for an ultrasound, can perform [facility] as well at next visit.    Adrian Prows MD [facility], [facility] 07/21/17 11:43 AM

## 2017-07-25 LAB — NUSWAB VAGINITIS PLUS (VG+)
CANDIDA ALBICANS, NAA: NEGATIVE
CHLAMYDIA TRACHOMATIS, NAA: NEGATIVE
Candida glabrata, NAA: NEGATIVE
NEISSERIA GONORRHOEAE, NAA: NEGATIVE
TRICH VAG BY NAA: NEGATIVE

## 2017-07-31 ENCOUNTER — Telehealth: Payer: Self-pay

## 2017-07-31 NOTE — Progress Notes (Signed)
Negative, released to mychart

## 2017-07-31 NOTE — Telephone Encounter (Signed)
Pt seen 07/21/17 bu [facility] & was given ABX BID x 7 days. She is still experiencing the same symptoms. Pt looking for further recommendations/meds. Pt states her copay for another visit is $100 & she would like to avoid another visit if possible. WG#[phone]

## 2017-08-01 ENCOUNTER — Other Ambulatory Visit: Payer: Self-pay | Admitting: Obstetrics and Gynecology

## 2017-08-01 ENCOUNTER — Encounter: Payer: Self-pay | Admitting: Obstetrics and Gynecology

## 2017-08-01 DIAGNOSIS — E2839 Other primary ovarian failure: Secondary | ICD-10-CM

## 2017-08-01 DIAGNOSIS — E288 Other ovarian dysfunction: Principal | ICD-10-CM

## 2017-08-01 DIAGNOSIS — N898 Other specified noninflammatory disorders of vagina: Secondary | ICD-10-CM

## 2017-08-01 NOTE — Telephone Encounter (Signed)
Please advise. Thank you

## 2017-08-01 NOTE — Telephone Encounter (Signed)
Called and discussed with patient.  She feels like her symptoms are similar to when she had an ovarian cyst in the past.  I offered to perform an transvaginal ultrasound for the patient but she said she will have to think about this because of her co-pay.  She describes a feeling of internal dryness.  She feels like she has pain with intercourse with deeper penetration.  Discussed options for lubrication or topical lidocaine for numbing of her vaginal vestibule.  She was recently negative for any vaginal infections.  Discussed obtaining [place] and estradiol labs to evaluate for early menopause and estrogen deficiency she can have these done without an  office visit.  Offered the patient referral for pelvic  floor physical therapy as well but she declined all of these options at this time.

## 2017-09-22 ENCOUNTER — Other Ambulatory Visit: Payer: Self-pay | Admitting: Family Medicine

## 2017-09-29 DIAGNOSIS — Z01419 Encounter for gynecological examination (general) (routine) without abnormal findings: Secondary | ICD-10-CM | POA: Diagnosis not present

## 2017-09-29 DIAGNOSIS — Z6829 Body mass index (BMI) 29.0-29.9, adult: Secondary | ICD-10-CM | POA: Diagnosis not present

## 2017-09-29 DIAGNOSIS — Z1329 Encounter for screening for other suspected endocrine disorder: Secondary | ICD-10-CM | POA: Diagnosis not present

## 2017-10-30 DIAGNOSIS — G43009 Migraine without aura, not intractable, without status migrainosus: Secondary | ICD-10-CM | POA: Diagnosis not present

## 2017-10-30 DIAGNOSIS — N760 Acute vaginitis: Secondary | ICD-10-CM | POA: Diagnosis not present

## 2017-10-30 DIAGNOSIS — N952 Postmenopausal atrophic vaginitis: Secondary | ICD-10-CM | POA: Diagnosis not present

## 2018-01-17 ENCOUNTER — Ambulatory Visit: Payer: 59 | Admitting: Family Medicine

## 2018-01-17 ENCOUNTER — Encounter: Payer: Self-pay | Admitting: Family Medicine

## 2018-01-17 VITALS — BP 134/88 | HR 83 | Temp 98.1°F | Wt 154.1 lb

## 2018-01-17 DIAGNOSIS — R1013 Epigastric pain: Secondary | ICD-10-CM

## 2018-01-17 LAB — BASIC METABOLIC PANEL
BUN: 21 mg/dL (ref 6–23)
CO2: 30 mEq/L (ref 19–32)
CREATININE: 0.72 mg/dL (ref 0.40–1.20)
Calcium: 9.8 mg/dL (ref 8.4–10.5)
Chloride: 101 mEq/L (ref 96–112)
GFR: 97.84 mL/min (ref >60.00)
Glucose, Bld: 85 mg/dL (ref 70–99)
Potassium: 4.2 mEq/L (ref 3.5–5.1)
Sodium: 139 mEq/L (ref 135–145)

## 2018-01-17 LAB — CBC WITH DIFFERENTIAL/PLATELET
BASOS PCT: 0.8 % (ref 0.0–3.0)
Basophils Absolute: 0.1 10*3/uL (ref 0.0–0.1)
EOS PCT: 4.2 % (ref 0.0–5.0)
Eosinophils Absolute: 0.4 10*3/uL (ref 0.0–0.7)
HEMATOCRIT: 42.8 % (ref 36.0–46.0)
Hemoglobin: 14.8 g/dL (ref 12.0–15.0)
LYMPHS ABS: 2.7 10*3/uL (ref 0.7–4.0)
LYMPHS PCT: 27.8 % (ref 12.0–46.0)
MCHC: 34.5 g/dL (ref 30.0–36.0)
MCV: 94.4 fl (ref 78.0–100.0)
MONOS PCT: 4.2 % (ref 3.0–12.0)
Monocytes Absolute: 0.4 10*3/uL (ref 0.1–1.0)
NEUTROS ABS: 6 10*3/uL (ref 1.4–7.7)
NEUTROS PCT: 63 % (ref 43.0–77.0)
PLATELETS: 251 10*3/uL (ref 150.0–400.0)
RBC: 4.53 Mil/uL (ref 3.87–5.11)
RDW: 11.5 % (ref 11.5–15.5)
WBC: 9.6 10*3/uL (ref 4.0–10.5)

## 2018-01-17 LAB — HEPATIC FUNCTION PANEL
ALBUMIN: 4.4 g/dL (ref 3.5–5.2)
ALT: 23 U/L (ref 0–35)
AST: 18 U/L (ref 0–37)
Alkaline Phosphatase: 66 U/L (ref 39–117)
Bilirubin, Direct: 0.1 mg/dL (ref 0.0–0.3)
TOTAL PROTEIN: 7 g/dL (ref 6.0–8.3)
Total Bilirubin: 0.6 mg/dL (ref 0.2–1.2)

## 2018-01-17 LAB — AMYLASE: Amylase: 47 U/L (ref 27–131)

## 2018-01-17 NOTE — Progress Notes (Signed)
   Subjective:    Patient ID: Nichole Mcneil, female    DOB: 07/30/1980, 37 y.o.   MRN: [id number]  HPI Here for 6 weeks of epigastric pains that come and go, abdominal bloating, nausea without vomiting, and watery stools. No fever. No recent travel or antibiotic use. Using Pepto-Bismol or TUMS helps for a short time.    Review of Systems  Constitutional: Negative.   Respiratory: Negative.   Cardiovascular: Negative.   Gastrointestinal: Positive for abdominal distention, abdominal pain, diarrhea and nausea. Negative for anal bleeding, blood in stool, constipation, rectal pain and vomiting.  Genitourinary: Negative.        Objective:   Physical Exam  Constitutional: She appears well-developed and well-nourished.  Neck: No thyromegaly present.  Cardiovascular: Normal rate, regular rhythm, normal heart sounds and intact distal pulses.  Pulmonary/Chest: Effort normal and breath sounds normal.  Abdominal: Soft. Bowel sounds are normal. She exhibits no distension and no mass. There is no rebound and no guarding. No hernia.  Mild epigastric tenderness   Lymphadenopathy:    She has no cervical adenopathy.          Assessment & Plan:  This is most consistent with either PUD or a gall bladder problem. We will get labs today and set up an abdominal US soon. She will try Prilosec 20 mg bid.  Alysia Penna, MD

## 2018-01-19 LAB — LUTEINIZING HORMONE: LH: 37.14 m[IU]/mL

## 2018-01-22 ENCOUNTER — Encounter: Payer: Self-pay | Admitting: *Deleted

## 2018-01-23 ENCOUNTER — Telehealth: Payer: Self-pay

## 2018-01-23 DIAGNOSIS — N760 Acute vaginitis: Secondary | ICD-10-CM | POA: Diagnosis not present

## 2018-01-23 DIAGNOSIS — Z3200 Encounter for pregnancy test, result unknown: Secondary | ICD-10-CM | POA: Diagnosis not present

## 2018-01-23 DIAGNOSIS — N952 Postmenopausal atrophic vaginitis: Secondary | ICD-10-CM | POA: Diagnosis not present

## 2018-01-23 DIAGNOSIS — R1013 Epigastric pain: Secondary | ICD-10-CM

## 2018-01-23 NOTE — Telephone Encounter (Signed)
Copied from [place] [id number]. Topic: General - Other >> Jan 23, 2018  2:05 PM Leward Quan A wrote: Reason for CRM: Patient called to inquire about referral for an Abdominal ultrasound that was discussed during visit on 01/17/18. Patient awaiting call back please   Ph# [phone]

## 2018-01-24 NOTE — Telephone Encounter (Signed)
The US was ordered  

## 2018-01-24 NOTE — Telephone Encounter (Signed)
Called and spoke with pt and she is aware that the [place] order has been placed. Nothing furhter is needed.

## 2018-01-24 NOTE — Telephone Encounter (Signed)
Pt called to f/up on this request.  Please notify pt when orders have been placed.

## 2018-01-24 NOTE — Telephone Encounter (Signed)
Dr. Sarajane Jews please advise on the US of the abd that was discuss for this pt. She is anxious about getting this done.  Thanks

## 2018-01-29 DIAGNOSIS — N915 Oligomenorrhea, unspecified: Secondary | ICD-10-CM | POA: Diagnosis not present

## 2018-01-29 DIAGNOSIS — R51 Headache: Secondary | ICD-10-CM | POA: Diagnosis not present

## 2018-01-29 DIAGNOSIS — H04129 Dry eye syndrome of unspecified lacrimal gland: Secondary | ICD-10-CM | POA: Diagnosis not present

## 2018-01-31 ENCOUNTER — Other Ambulatory Visit: Payer: 59

## 2018-02-19 ENCOUNTER — Other Ambulatory Visit: Payer: 59

## 2018-05-04 DIAGNOSIS — L710 Perioral dermatitis: Secondary | ICD-10-CM | POA: Diagnosis not present

## 2018-05-04 DIAGNOSIS — D485 Neoplasm of uncertain behavior of skin: Secondary | ICD-10-CM | POA: Diagnosis not present

## 2018-05-04 DIAGNOSIS — D225 Melanocytic nevi of trunk: Secondary | ICD-10-CM | POA: Diagnosis not present

## 2018-05-04 DIAGNOSIS — Z1283 Encounter for screening for malignant neoplasm of skin: Secondary | ICD-10-CM | POA: Diagnosis not present

## 2018-07-13 ENCOUNTER — Other Ambulatory Visit: Payer: Self-pay

## 2018-07-13 ENCOUNTER — Encounter: Payer: Self-pay | Admitting: Family Medicine

## 2018-07-13 ENCOUNTER — Ambulatory Visit: Payer: Self-pay | Admitting: Family Medicine

## 2018-07-13 ENCOUNTER — Ambulatory Visit (INDEPENDENT_AMBULATORY_CARE_PROVIDER_SITE_OTHER): Payer: 59 | Admitting: Family Medicine

## 2018-07-13 VITALS — BP 150/100 | HR 90 | Temp 98.0°F | Ht 59.0 in | Wt 155.6 lb

## 2018-07-13 DIAGNOSIS — I10 Essential (primary) hypertension: Secondary | ICD-10-CM | POA: Diagnosis not present

## 2018-07-13 DIAGNOSIS — G43809 Other migraine, not intractable, without status migrainosus: Secondary | ICD-10-CM | POA: Diagnosis not present

## 2018-07-13 MED ORDER — PROPRANOLOL HCL 40 MG PO TABS: ORAL_TABLET | ORAL | 2 refills | Status: DC

## 2018-07-13 NOTE — Telephone Encounter (Signed)
Pt called in c/o BP being elevated.  See triage notes.  I warm transferred the call to Dr. Barbie Banner office for scheduling.   COVID-19 pandemic so they are scheduling pts.  Tammy is scheduling her.  I sent my notes to the office of Dr. Sarajane Jews at [place].  Reason for Disposition . Systolic BP  >= 888 OR Diastolic >= 280  Answer Assessment - Initial Assessment Questions 1. BLOOD PRESSURE: "What is the blood pressure?" "Did you take at least two measurements 5 minutes apart?"     150/98 at the dentist yesterday.    Today checked at work 160/104 (works at a [place] office)   I've seen Dr. Sarajane Jews for this about a year ago and it was stress. 2. ONSET: "When did you take your blood pressure?"     This morning 3. HOW: "How did you obtain the blood pressure?" (e.g., visiting nurse, automatic home BP monitor)     I have a machine at home but these pressures were from dentist office and my work.  See above. 4. HISTORY: "Do you have a history of high blood pressure?"     No 5. MEDICATIONS: "Are you taking any medications for blood pressure?" "Have you missed any doses recently?"     No 6. OTHER SYMPTOMS: "Do you have any symptoms?" (e.g., headache, chest pain, blurred vision, difficulty breathing, weakness)     I had a headache yesterday and today.  No visual changes or chest pain. I've noticed my nose burns.  I smoke like a smoke smell.   It's intermittent.  It's been there all week this week.    7. PREGNANCY: "Is there any chance you are pregnant?" "When was your last menstrual period?"     No.  Protocols used: HIGH BLOOD PRESSURE-A-AH

## 2018-07-13 NOTE — Progress Notes (Signed)
Nichole Mcneil DOB: [birth year] Encounter date: 07/13/2018  This is a 38 y.o. female who presents with Chief Complaint  Patient presents with  . Hypertension    History of present illness:  Thinks last year at some time - obgyn checked bp and it was high. Was monitored for a couple of days and did a follow up here and ekg was done along with cxr (was having chest pains then). Work up was ok at that time.   Yesterday 150/98 bp at dentist. Headache yesterday and today. Checked at work today 170/110; resting was 160/104 and then 20 min later was back up to 170/110.   Still has headache; slightly eased with tylenol.   This week has had feeling of smelling burning sensation in nose. Has had this in past and other people told her it was allegies/sinus. Has had some dry eyes, but no other nasal symptoms.   No chest pain, no sob, no weakness, numbness, tingling.   Weight in last few years has been hard issue. Has been trying for past couple of years to lose weight. Feels like she fluctuates a lot. Was down to 145 about a year ago; but since then hasn't been able to get down below 150. Exercises a few days/week; dad has been successful with weight loss. She has tried to do weight loss program along with dad, plant based diet. Just feels like she can't get down below the 150lb.   No issues with swelling in legs but has MD so has some swelling left foot which is typical for her.   Occasional check at home 130-150/90-100.    Energy level sometimes good; sometimes feels like she is very fatigued. Not sure what is coming from muscular dystrophy or not.    Allergies  Allergen Reactions  . Erythromycin   . Metronidazole Other (See Comments)    May aggravate neuropathy  . Nitrofurantoin     May aggravate neuropathy  . Sulfa Antibiotics    Current Meds  Medication Sig  . Ascorbic Acid (VITAMIN C) 1000 MG tablet Take 1,000 mg by mouth daily.  Marland Kitchen BIOTIN PO Take by mouth daily.  .  Estrogens, Conjugated (PREMARIN VA) Place vaginally daily.  Marland Kitchen MAGNESIUM PO Take by mouth 2 (two) times a day.  Marland Kitchen OVER THE COUNTER MEDICATION Gaba  . OVER THE COUNTER MEDICATION Maca powder  . SUMAtriptan (IMITREX) 100 MG tablet TAKE 1 TABLET BY MOUTH EVERY 2 HOURS AS NEEDED   Current Facility-Administered Medications for the 07/13/18 encounter (Office Visit) with Caren Macadam, MD  Medication  . betamethasone acetate-betamethasone sodium phosphate (CELESTONE) injection 3 mg   Active Ambulatory Problems    Diagnosis Date Noted  . Muscular dystrophy ([place])   . Charcot-Marie-Tooth disease   . Premenstrual dysphoric syndrome 08/28/2011  . Anal or rectal pain 08/28/2011  . Gout 11/04/2013  . Migraines 11/04/2013  . Shingles 06/16/2014  . Environmental allergies 02/20/2015  . Constipation 09/23/2016  . Oligomenorrhea 09/23/2016  . Left foot pain 10/08/2013  . Pes cavus, congenital 10/08/2013   Resolved Ambulatory Problems    Diagnosis Date Noted  . Neuropathy, hereditary motor and sensory type IV 08/28/2011   Past Medical History:  Diagnosis Date  . Charcot Marie Tooth muscular atrophy      Review of Systems  Constitutional: Negative for chills, fatigue and fever.  Respiratory: Negative for cough, chest tightness, shortness of breath and wheezing.   Cardiovascular: Negative for chest pain, palpitations and leg swelling.  Neurological:  Positive for headaches (slight). Negative for dizziness, speech difficulty, weakness and numbness.    Objective:  BP (!) 150/100 (BP Location: Left Arm, Patient Position: Sitting, Cuff Size: Normal)   Pulse 90   Temp 98 F (36.7 C) (Oral)   Ht 4\' 11"  (1.499 m)   Wt 155 lb 9.6 oz (70.6 kg)   SpO2 98%   BMI 31.43 kg/m   Weight: 155 lb 9.6 oz (70.6 kg)  Recheck bp was 140/90 BP Readings from Last 3 Encounters:  07/13/18 (!) 150/100  01/17/18 134/88  07/21/17 120/88   Wt Readings from Last 3 Encounters:  07/13/18 155 lb 9.6 oz (70.6  kg)  01/17/18 154 lb 2 oz (69.9 kg)  07/21/17 148 lb (67.1 kg)    Physical Exam Constitutional:      General: She is not in acute distress.    Appearance: She is well-developed.  Cardiovascular:     Rate and Rhythm: Normal rate and regular rhythm.     Heart sounds: Normal heart sounds. No murmur. No friction rub.  Pulmonary:     Effort: Pulmonary effort is normal. No respiratory distress.     Breath sounds: Normal breath sounds. No wheezing or rales.  Musculoskeletal:     Right lower leg: No edema.     Left lower leg: No edema.  Neurological:     Mental Status: She is alert and oriented to person, place, and time.  Psychiatric:        Behavior: Behavior normal.     Assessment/Plan  1. Hypertension, unspecified type Discussed medication options after discussion of benefits of bp control. We decided to try propranolol in hopes that this will give benefit to help with migraines as well. She will update me in 1 week with blood pressure results. Follow up with Dr. Sarajane Jews in 1 mo to recheck (doxy likely ok). She is interested in losing more weight; consider referral to weight loss clinic at next visit.  Discussed new medication(s) today with patient. Discussed potential side effects and patient verbalized understanding.  - Comprehensive metabolic panel; Future - CBC with Differential/Platelet; Future - CBC with Differential/Platelet - Comprehensive metabolic panel  2. Other migraine without status migrainosus, not intractable See above. Gets relief with imitrex. Will track and see if propranolol helps with frequency.        Micheline Rough, MD

## 2018-07-13 NOTE — Patient Instructions (Signed)
DASH Eating Plan  DASH stands for "Dietary Approaches to Stop Hypertension." The DASH eating plan is a healthy eating plan that has been shown to reduce high blood pressure (hypertension). It may also reduce your risk for type 2 diabetes, heart disease, and stroke. The DASH eating plan may also help with weight loss.  What are tips for following this plan?    General guidelines   Avoid eating more than 2,300 mg (milligrams) of salt (sodium) a day. If you have hypertension, you may need to reduce your sodium intake to 1,500 mg a day.   Limit alcohol intake to no more than 1 drink a day for nonpregnant women and 2 drinks a day for men. One drink equals 12 oz of beer, 5 oz of wine, or 1 oz of hard liquor.   Work with your health care provider to maintain a healthy body weight or to lose weight. Ask what an ideal weight is for you.   Get at least 30 minutes of exercise that causes your heart to beat faster (aerobic exercise) most days of the week. Activities may include walking, swimming, or biking.   Work with your health care provider or diet and nutrition specialist (dietitian) to adjust your eating plan to your individual calorie needs.  Reading food labels     Check food labels for the amount of sodium per serving. Choose foods with less than 5 percent of the Daily Value of sodium. Generally, foods with less than 300 mg of sodium per serving fit into this eating plan.   To find whole grains, look for the word "whole" as the first word in the ingredient list.  Shopping   Buy products labeled as "low-sodium" or "no salt added."   Buy fresh foods. Avoid canned foods and premade or frozen meals.  Cooking   Avoid adding salt when cooking. Use salt-free seasonings or herbs instead of table salt or sea salt. Check with your health care provider or pharmacist before using salt substitutes.   Do not fry foods. Cook foods using healthy methods such as baking, boiling, grilling, and broiling instead.   Cook with  heart-healthy oils, such as olive, canola, soybean, or sunflower oil.  Meal planning   Eat a balanced diet that includes:  ? 5 or more servings of fruits and vegetables each day. At each meal, try to fill half of your plate with fruits and vegetables.  ? Up to 6-8 servings of whole grains each day.  ? Less than 6 oz of lean meat, poultry, or fish each day. A 3-oz serving of meat is about the same size as a deck of cards. One egg equals 1 oz.  ? 2 servings of low-fat dairy each day.  ? A serving of nuts, seeds, or beans 5 times each week.  ? Heart-healthy fats. Healthy fats called Omega-3 fatty acids are found in foods such as flaxseeds and coldwater fish, like sardines, salmon, and mackerel.   Limit how much you eat of the following:  ? Canned or prepackaged foods.  ? Food that is high in trans fat, such as fried foods.  ? Food that is high in saturated fat, such as fatty meat.  ? Sweets, desserts, sugary drinks, and other foods with added sugar.  ? Full-fat dairy products.   Do not salt foods before eating.   Try to eat at least 2 vegetarian meals each week.   Eat more home-cooked food and less restaurant, buffet, and fast food.     When eating at a restaurant, ask that your food be prepared with less salt or no salt, if possible.  What foods are recommended?  The items listed may not be a complete list. Talk with your dietitian about what dietary choices are best for you.  Grains  Whole-grain or whole-wheat bread. Whole-grain or whole-wheat pasta. Brown rice. Oatmeal. Quinoa. Bulgur. Whole-grain and low-sodium cereals. Pita bread. Low-fat, low-sodium crackers. Whole-wheat flour tortillas.  Vegetables  Fresh or frozen vegetables (raw, steamed, roasted, or grilled). Low-sodium or reduced-sodium tomato and vegetable juice. Low-sodium or reduced-sodium tomato sauce and tomato paste. Low-sodium or reduced-sodium canned vegetables.  Fruits  All fresh, dried, or frozen fruit. Canned fruit in natural juice (without  added sugar).  Meat and other protein foods  Skinless chicken or turkey. Ground chicken or turkey. Pork with fat trimmed off. Fish and seafood. Egg whites. Dried beans, peas, or lentils. Unsalted nuts, nut butters, and seeds. Unsalted canned beans. Lean cuts of beef with fat trimmed off. Low-sodium, lean deli meat.  Dairy  Low-fat (1%) or fat-free (skim) milk. Fat-free, low-fat, or reduced-fat cheeses. Nonfat, low-sodium ricotta or cottage cheese. Low-fat or nonfat yogurt. Low-fat, low-sodium cheese.  Fats and oils  Soft margarine without trans fats. Vegetable oil. Low-fat, reduced-fat, or light mayonnaise and salad dressings (reduced-sodium). Canola, safflower, olive, soybean, and sunflower oils. Avocado.  Seasoning and other foods  Herbs. Spices. Seasoning mixes without salt. Unsalted popcorn and pretzels. Fat-free sweets.  What foods are not recommended?  The items listed may not be a complete list. Talk with your dietitian about what dietary choices are best for you.  Grains  Baked goods made with fat, such as croissants, muffins, or some breads. Dry pasta or rice meal packs.  Vegetables  Creamed or fried vegetables. Vegetables in a cheese sauce. Regular canned vegetables (not low-sodium or reduced-sodium). Regular canned tomato sauce and paste (not low-sodium or reduced-sodium). Regular tomato and vegetable juice (not low-sodium or reduced-sodium). Pickles. Olives.  Fruits  Canned fruit in a light or heavy syrup. Fried fruit. Fruit in cream or butter sauce.  Meat and other protein foods  Fatty cuts of meat. Ribs. Fried meat. Bacon. Sausage. Bologna and other processed lunch meats. Salami. Fatback. Hotdogs. Bratwurst. Salted nuts and seeds. Canned beans with added salt. Canned or smoked fish. Whole eggs or egg yolks. Chicken or turkey with skin.  Dairy  Whole or 2% milk, cream, and half-and-half. Whole or full-fat cream cheese. Whole-fat or sweetened yogurt. Full-fat cheese. Nondairy creamers. Whipped toppings.  Processed cheese and cheese spreads.  Fats and oils  Butter. Stick margarine. Lard. Shortening. Ghee. Bacon fat. Tropical oils, such as coconut, palm kernel, or palm oil.  Seasoning and other foods  Salted popcorn and pretzels. Onion salt, garlic salt, seasoned salt, table salt, and sea salt. Worcestershire sauce. Tartar sauce. Barbecue sauce. Teriyaki sauce. Soy sauce, including reduced-sodium. Steak sauce. Canned and packaged gravies. Fish sauce. Oyster sauce. Cocktail sauce. Horseradish that you find on the shelf. Ketchup. Mustard. Meat flavorings and tenderizers. Bouillon cubes. Hot sauce and Tabasco sauce. Premade or packaged marinades. Premade or packaged taco seasonings. Relishes. Regular salad dressings.  Where to find more information:   National Heart, Lung, and Blood Institute: www.nhlbi.nih.gov   American Heart Association: www.heart.org  Summary   The DASH eating plan is a healthy eating plan that has been shown to reduce high blood pressure (hypertension). It may also reduce your risk for type 2 diabetes, heart disease, and stroke.   With the   DASH eating plan, you should limit salt (sodium) intake to 2,300 mg a day. If you have hypertension, you may need to reduce your sodium intake to 1,500 mg a day.   When on the DASH eating plan, aim to eat more fresh fruits and vegetables, whole grains, lean proteins, low-fat dairy, and heart-healthy fats.   Work with your health care provider or diet and nutrition specialist (dietitian) to adjust your eating plan to your individual calorie needs.  This information is not intended to replace advice given to you by your health care provider. Make sure you discuss any questions you have with your health care provider.  Document Released: 01/27/2011 Document Revised: 02/01/2016 Document Reviewed: 02/01/2016  Elsevier Interactive Patient Education  2019 Elsevier Inc.

## 2018-07-13 NOTE — Telephone Encounter (Signed)
Noted. Will address at visit today

## 2018-07-14 LAB — COMPREHENSIVE METABOLIC PANEL
AG Ratio: 1.7 (calc) (ref 1.0–2.5)
ALT: 17 U/L (ref 6–29)
AST: 16 U/L (ref 10–30)
Albumin: 4.7 g/dL (ref 3.6–5.1)
Alkaline phosphatase (APISO): 62 U/L (ref 31–125)
BUN: 14 mg/dL (ref 7–25)
CO2: 28 mmol/L (ref 20–32)
Calcium: 10 mg/dL (ref 8.6–10.2)
Chloride: 103 mmol/L (ref 98–110)
Creat: 0.69 mg/dL (ref 0.50–1.10)
Globulin: 2.7 g/dL (calc) (ref 1.9–3.7)
Glucose, Bld: 91 mg/dL (ref 65–99)
Potassium: 3.9 mmol/L (ref 3.5–5.3)
Sodium: 139 mmol/L (ref 135–146)
Total Bilirubin: 0.5 mg/dL (ref 0.2–1.2)
Total Protein: 7.4 g/dL (ref 6.1–8.1)

## 2018-07-14 LAB — CBC WITH DIFFERENTIAL/PLATELET
Absolute Monocytes: 551 cells/uL (ref 200–950)
Basophils Absolute: 105 cells/uL (ref 0–200)
Basophils Relative: 1.1 %
Eosinophils Absolute: 466 cells/uL (ref 15–500)
Eosinophils Relative: 4.9 %
HCT: 44 % (ref 35.0–45.0)
Hemoglobin: 14.7 g/dL (ref 11.7–15.5)
Lymphs Abs: 3677 cells/uL (ref 850–3900)
MCH: 31.3 pg (ref 27.0–33.0)
MCHC: 33.4 g/dL (ref 32.0–36.0)
MCV: 93.6 fL (ref 80.0–100.0)
MPV: 11.2 fL (ref 7.5–12.5)
Monocytes Relative: 5.8 %
Neutro Abs: 4703 cells/uL (ref 1500–7800)
Neutrophils Relative %: 49.5 %
Platelets: 290 10*3/uL (ref 140–400)
RBC: 4.7 10*6/uL (ref 3.80–5.10)
RDW: 11.3 % (ref 11.0–15.0)
Total Lymphocyte: 38.7 %
WBC: 9.5 10*3/uL (ref 3.8–10.8)

## 2018-07-17 NOTE — Progress Notes (Signed)
Your blood counts, kidney function/blood sugar/electrolytes look good. Please just give me an update on how your blood pressures are looking at home and how you are doing with new medication sometime this week or next.

## 2018-07-19 ENCOUNTER — Encounter: Payer: Self-pay | Admitting: Family Medicine

## 2018-11-06 ENCOUNTER — Encounter: Payer: Self-pay | Admitting: Family Medicine

## 2018-11-06 ENCOUNTER — Telehealth (INDEPENDENT_AMBULATORY_CARE_PROVIDER_SITE_OTHER): Payer: 59 | Admitting: Family Medicine

## 2018-11-06 ENCOUNTER — Other Ambulatory Visit: Payer: Self-pay

## 2018-11-06 DIAGNOSIS — J019 Acute sinusitis, unspecified: Secondary | ICD-10-CM | POA: Diagnosis not present

## 2018-11-06 MED ORDER — AMOXICILLIN-POT CLAVULANATE 875-125 MG PO TABS: 1.0000 | ORAL_TABLET | Freq: Two times a day (BID) | ORAL | 0 refills | Status: DC

## 2018-11-06 NOTE — Progress Notes (Signed)
Virtual Visit via Video Note  I connected with the patient on 11/06/18 at 10:00 AM EDT by a video enabled telemedicine application and verified that I am speaking with the correct person using two identifiers.  Location patient: home Location provider:work or home office Persons participating in the virtual visit: patient, provider  I discussed the limitations of evaluation and management by telemedicine and the availability of in person appointments. The patient expressed understanding and agreed to proceed.   HPI: Here for 3 weeks of sinus pressure, PND, blowing green mucus from the nose, chest congestion, and a dry cough. No fever or ST. No SOB or chest pain. No body aches or NVD.    ROS: See pertinent positives and negatives per HPI.  Past Medical History:  Diagnosis Date  . Charcot Marie Tooth muscular atrophy   . Migraines   . Muscular dystrophy ([place])    at age 18   . Muscular dystrophy [facility])     Past Surgical History:  Procedure Laterality Date  . TUBAL LIGATION      Family History  Problem Relation Age of Onset  . Muscular dystrophy Mother   . Hyperlipidemia Mother   . Endometriosis Mother   . Migraines Father   . Nephrolithiasis Father   . Hypertension Father   . Arthritis Brother   . Aneurysm Maternal Uncle   . Stroke Maternal Grandmother   . Aneurysm Maternal Grandfather   . Migraines Paternal Grandmother   . Cancer Paternal Grandfather        lung     Current Outpatient Medications:  .  amoxicillin-clavulanate (AUGMENTIN) 875-125 MG tablet, Take 1 tablet by mouth 2 (two) times daily., Disp: 20 tablet, Rfl: 0 .  Ascorbic Acid (VITAMIN C) 1000 MG tablet, Take 1,000 mg by mouth daily., Disp: , Rfl:  .  BIOTIN PO, Take by mouth daily., Disp: , Rfl:  .  Estrogens, Conjugated (PREMARIN VA), Place vaginally daily., Disp: , Rfl:  .  MAGNESIUM PO, Take by mouth 2 (two) times a day., Disp: , Rfl:  .  OVER THE COUNTER MEDICATION, Gaba, Disp: , Rfl:  .  OVER THE  COUNTER MEDICATION, Maca powder, Disp: , Rfl:  .  propranolol (INDERAL) 40 MG tablet, Start with 1/2 tab twice daily and increase to full tab BID as tolerated., Disp: 60 tablet, Rfl: 2 .  SUMAtriptan (IMITREX) 100 MG tablet, TAKE 1 TABLET BY MOUTH EVERY 2 HOURS AS NEEDED, Disp: 10 tablet, Rfl: 11  Current Facility-Administered Medications:  .  betamethasone acetate-betamethasone sodium phosphate (CELESTONE) injection 3 mg, 3 mg, Intramuscular, Once, Evans, Dorathy Daft, DPM  EXAM:  VITALS per patient if applicable:  GENERAL: alert, oriented, appears well and in no acute distress  HEENT: atraumatic, conjunttiva clear, no obvious abnormalities on inspection of external nose and ears  NECK: normal movements of the head and neck  LUNGS: on inspection no signs of respiratory distress, breathing rate appears normal, no obvious gross SOB, gasping or wheezing  CV: no obvious cyanosis  MS: moves all visible extremities without noticeable abnormality  PSYCH/NEURO: pleasant and cooperative, no obvious depression or anxiety, speech and thought processing grossly intact  ASSESSMENT AND PLAN: Sinusitis, treat with Augmentin. Add Mucinex and Delsym prn.  Alysia Penna, MD  Discussed the following assessment and plan:  No diagnosis found.     I discussed the assessment and treatment plan with the patient. The patient was provided an opportunity to ask questions and all were answered. The patient agreed with  the plan and demonstrated an understanding of the instructions.   The patient was advised to call back or seek an in-person evaluation if the symptoms worsen or if the condition fails to improve as anticipated.

## 2019-02-08 ENCOUNTER — Other Ambulatory Visit: Payer: Self-pay | Admitting: Family Medicine

## 2019-03-27 DIAGNOSIS — N760 Acute vaginitis: Secondary | ICD-10-CM | POA: Diagnosis not present

## 2019-03-27 DIAGNOSIS — Z3202 Encounter for pregnancy test, result negative: Secondary | ICD-10-CM | POA: Diagnosis not present

## 2019-03-27 DIAGNOSIS — R1031 Right lower quadrant pain: Secondary | ICD-10-CM | POA: Diagnosis not present

## 2019-03-27 DIAGNOSIS — R102 Pelvic and perineal pain: Secondary | ICD-10-CM | POA: Diagnosis not present

## 2019-03-27 DIAGNOSIS — N898 Other specified noninflammatory disorders of vagina: Secondary | ICD-10-CM | POA: Diagnosis not present

## 2019-03-29 ENCOUNTER — Telehealth: Payer: Self-pay

## 2019-03-29 NOTE — Telephone Encounter (Signed)
Pt calling; had Essure done by [facility] in 2015; it is now embedded in the uterus; her current MD wants to know what the position of left coil was on u/s - was it still in tube or starting to move? Where was it at? [phone]  Pt aware - Essure done 09/25/13, CT done 11/06/13 right coil missing as it had expelled that morning, left coil was centered in the uterotubal junction.  Adv pt getting these records to her doctor may be more beneficial to him.  Need ROI.

## 2019-04-01 DIAGNOSIS — R102 Pelvic and perineal pain: Secondary | ICD-10-CM | POA: Diagnosis not present

## 2019-04-01 DIAGNOSIS — N8189 Other female genital prolapse: Secondary | ICD-10-CM | POA: Diagnosis not present

## 2019-04-08 ENCOUNTER — Other Ambulatory Visit: Payer: Self-pay

## 2019-04-08 ENCOUNTER — Telehealth (INDEPENDENT_AMBULATORY_CARE_PROVIDER_SITE_OTHER): Payer: BC Managed Care – PPO | Admitting: Family Medicine

## 2019-04-08 DIAGNOSIS — J019 Acute sinusitis, unspecified: Secondary | ICD-10-CM

## 2019-04-08 MED ORDER — AMOXICILLIN-POT CLAVULANATE 875-125 MG PO TABS: 1.0000 | ORAL_TABLET | Freq: Two times a day (BID) | ORAL | 0 refills | Status: DC

## 2019-04-08 MED ORDER — BENZONATATE 200 MG PO CAPS: 200.0000 mg | ORAL_CAPSULE | Freq: Two times a day (BID) | ORAL | 0 refills | Status: DC | PRN

## 2019-04-08 NOTE — Progress Notes (Signed)
Virtual Visit via Video Note  I connected with the patient on 04/08/19 at  8:30 AM EST by a video enabled telemedicine application and verified that I am speaking with the correct person using two identifiers.  Location patient: home Location provider:work or home office Persons participating in the virtual visit: patient, provider  I discussed the limitations of evaluation and management by telemedicine and the availability of in person appointments. The patient expressed understanding and agreed to proceed.   HPI: Here for 5 days of sinus pressure, PND, and a dry cough. No fever. Her son was treated for a sinus infection a week ago.    ROS: See pertinent positives and negatives per HPI.  Past Medical History:  Diagnosis Date  . Charcot Marie Tooth muscular atrophy   . Migraines   . Muscular dystrophy ([place])    at age 81   . Muscular dystrophy [facility])     Past Surgical History:  Procedure Laterality Date  . TUBAL LIGATION      Family History  Problem Relation Age of Onset  . Muscular dystrophy Mother   . Hyperlipidemia Mother   . Endometriosis Mother   . Migraines Father   . Nephrolithiasis Father   . Hypertension Father   . Arthritis Brother   . Aneurysm Maternal Uncle   . Stroke Maternal Grandmother   . Aneurysm Maternal Grandfather   . Migraines Paternal Grandmother   . Cancer Paternal Grandfather        lung     Current Outpatient Medications:  .  amoxicillin-clavulanate (AUGMENTIN) 875-125 MG tablet, Take 1 tablet by mouth 2 (two) times daily., Disp: 20 tablet, Rfl: 0 .  Ascorbic Acid (VITAMIN C) 1000 MG tablet, Take 1,000 mg by mouth daily., Disp: , Rfl:  .  benzonatate (TESSALON) 200 MG capsule, Take 1 capsule (200 mg total) by mouth 2 (two) times daily as needed for cough., Disp: 60 capsule, Rfl: 0 .  BIOTIN PO, Take by mouth daily., Disp: , Rfl:  .  Estrogens, Conjugated (PREMARIN VA), Place vaginally daily., Disp: , Rfl:  .  MAGNESIUM PO, Take by mouth 2  (two) times a day., Disp: , Rfl:  .  OVER THE COUNTER MEDICATION, Gaba, Disp: , Rfl:  .  OVER THE COUNTER MEDICATION, Maca powder, Disp: , Rfl:  .  propranolol (INDERAL) 40 MG tablet, START BY TAKING 1/2 A TABLET BY MOUTH TWICE DAILY. INCREASE TO 1 WHOLE TABLET BY MOUTH TWICE DAILY AS TOLERATED., Disp: 60 tablet, Rfl: 2 .  SUMAtriptan (IMITREX) 100 MG tablet, TAKE 1 TABLET BY MOUTH EVERY 2 HOURS AS NEEDED, Disp: 10 tablet, Rfl: 11  Current Facility-Administered Medications:  .  betamethasone acetate-betamethasone sodium phosphate (CELESTONE) injection 3 mg, 3 mg, Intramuscular, Once, Evans, Dorathy Daft, DPM  EXAM:  VITALS per patient if applicable:  GENERAL: alert, oriented, appears well and in no acute distress  HEENT: atraumatic, conjunttiva clear, no obvious abnormalities on inspection of external nose and ears  NECK: normal movements of the head and neck  LUNGS: on inspection no signs of respiratory distress, breathing rate appears normal, no obvious gross SOB, gasping or wheezing  CV: no obvious cyanosis  MS: moves all visible extremities without noticeable abnormality  PSYCH/NEURO: pleasant and cooperative, no obvious depression or anxiety, speech and thought processing grossly intact  ASSESSMENT AND PLAN: Sinusitis, treat with Augmentin. Alysia Penna, MD  Discussed the following assessment and plan:  No diagnosis found.     I discussed the assessment and treatment  plan with the patient. The patient was provided an opportunity to ask questions and all were answered. The patient agreed with the plan and demonstrated an understanding of the instructions.   The patient was advised to call back or seek an in-person evaluation if the symptoms worsen or if the condition fails to improve as anticipated.

## 2019-04-29 DIAGNOSIS — Z5331 Laparoscopic surgical procedure converted to open procedure: Secondary | ICD-10-CM | POA: Diagnosis not present

## 2019-04-29 DIAGNOSIS — N8302 Follicular cyst of left ovary: Secondary | ICD-10-CM | POA: Diagnosis not present

## 2019-04-29 DIAGNOSIS — N920 Excessive and frequent menstruation with regular cycle: Secondary | ICD-10-CM | POA: Diagnosis not present

## 2019-04-29 DIAGNOSIS — N8301 Follicular cyst of right ovary: Secondary | ICD-10-CM | POA: Diagnosis not present

## 2019-04-29 DIAGNOSIS — N736 Female pelvic peritoneal adhesions (postinfective): Secondary | ICD-10-CM | POA: Diagnosis not present

## 2019-04-29 DIAGNOSIS — R102 Pelvic and perineal pain: Secondary | ICD-10-CM | POA: Diagnosis not present

## 2019-05-08 DIAGNOSIS — R309 Painful micturition, unspecified: Secondary | ICD-10-CM | POA: Diagnosis not present

## 2019-07-13 ENCOUNTER — Other Ambulatory Visit: Payer: Self-pay | Admitting: Family Medicine

## 2019-07-15 ENCOUNTER — Encounter: Payer: Self-pay | Admitting: Podiatry

## 2019-07-15 ENCOUNTER — Ambulatory Visit (INDEPENDENT_AMBULATORY_CARE_PROVIDER_SITE_OTHER): Payer: BC Managed Care – PPO | Admitting: Podiatry

## 2019-07-15 ENCOUNTER — Other Ambulatory Visit: Payer: Self-pay

## 2019-07-15 ENCOUNTER — Other Ambulatory Visit: Payer: Self-pay | Admitting: Family Medicine

## 2019-07-15 DIAGNOSIS — G600 Hereditary motor and sensory neuropathy: Secondary | ICD-10-CM

## 2019-07-15 DIAGNOSIS — Q667 Congenital pes cavus, unspecified foot: Secondary | ICD-10-CM

## 2019-07-15 DIAGNOSIS — M659 Synovitis and tenosynovitis, unspecified: Secondary | ICD-10-CM | POA: Diagnosis not present

## 2019-07-15 MED ORDER — MELOXICAM 15 MG PO TABS: 15.0000 mg | ORAL_TABLET | Freq: Every day | ORAL | 1 refills | Status: DC

## 2019-07-18 NOTE — Progress Notes (Signed)
Patient ID: Nichole Mcneil, female   DOB: March 08, 1980, 39 y.o.   MRN: [id number]   HPI: 39 year old female with a history of known Charcot-Marie-Tooth muscular atrophy presents today for follow up evaluation of left foot and ankle pain. She reports aching pain. She states she normally gets an injection every 1-2 years which helps alleviate her symptoms. She denies modifying factors. Patient is here for further evaluation and treatment.   Past Medical History:  Diagnosis Date  . Charcot Marie Tooth muscular atrophy   . Migraines   . Muscular dystrophy ([place])    at age 58   . Muscular dystrophy [facility])      Physical Exam: General: The patient is alert and oriented x3 in no acute distress.  Dermatology: Skin is warm, dry and supple bilateral lower extremities. Negative for open lesions or macerations.  Vascular: Palpable pedal pulses bilaterally. No erythema noted. Capillary refill within normal limits.  Neurological: Epicritic and protective threshold grossly intact bilaterally.   Musculoskeletal Exam: Loss of intrinsic muscular atrophy with increased longitudinal arches bilateral. Limited range of motion noted with the subtalar joint and rear foot inversion also noted. Pain with palpation noted to the anterior, lateral and medial aspects of the left ankle joint.   Assessment: 1. 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 Ankle synovitis left     Plan of Care:  1. Patient was evaluated today.  2. Injection of 0.5 mLs Celestone Soluspan injected into the left ankle joint.  3. Appointment with Pedorthist for custom molded orthotics.  4. Continue using OTC ankle brace and wearing New Balance shoes.  5. Prescription for Meloxicam provided to patient. 6. Return to clinic as needed.   Works for an ophthalmologist.    Edrick Kins, DPM [facility]  Dr. Edrick Kins, DPM    [address], [place] [address]                Office [phone]  Fax [phone]

## 2019-07-19 ENCOUNTER — Other Ambulatory Visit: Payer: Self-pay

## 2019-07-23 ENCOUNTER — Other Ambulatory Visit: Payer: Self-pay

## 2019-07-23 ENCOUNTER — Encounter: Payer: Self-pay | Admitting: Family Medicine

## 2019-07-23 ENCOUNTER — Ambulatory Visit (INDEPENDENT_AMBULATORY_CARE_PROVIDER_SITE_OTHER): Payer: BC Managed Care – PPO | Admitting: Family Medicine

## 2019-07-23 VITALS — BP 118/60 | HR 68 | Temp 98.3°F | Wt 151.4 lb

## 2019-07-23 DIAGNOSIS — I10 Essential (primary) hypertension: Secondary | ICD-10-CM | POA: Diagnosis not present

## 2019-07-23 DIAGNOSIS — B958 Unspecified staphylococcus as the cause of diseases classified elsewhere: Secondary | ICD-10-CM

## 2019-07-23 DIAGNOSIS — G43809 Other migraine, not intractable, without status migrainosus: Secondary | ICD-10-CM | POA: Diagnosis not present

## 2019-07-23 MED ORDER — MUPIROCIN 2 % EX OINT: 1.0000 "application " | TOPICAL_OINTMENT | Freq: Two times a day (BID) | CUTANEOUS | 0 refills | Status: DC

## 2019-07-23 MED ORDER — PROPRANOLOL HCL 40 MG PO TABS: 40.0000 mg | ORAL_TABLET | Freq: Every day | ORAL | 3 refills | Status: DC

## 2019-07-23 NOTE — Progress Notes (Signed)
   Subjective:    Patient ID: Nichole Mcneil, female    DOB: Sep 19, 1980, 39 y.o.   MRN: [id number]  HPI Here for medication review and to ask about a sore spot in the right nostril that appeared 3 weeks ago. She has never had this before. She also needs refills on Propranolol. This was started one year ago both to control her BP and also to prevent migraines. This has worked very well. Her BP is stable and she has not had a single migraine since starting it.    Review of Systems  Constitutional: Negative.   Respiratory: Negative.   Cardiovascular: Negative.   Neurological: Negative.        Objective:   Physical Exam Constitutional:      Appearance: Normal appearance.  HENT:     Nose:     Comments: The right nostril has an irritated area of mucosa on the septum near the tip Cardiovascular:     Rate and Rhythm: Normal rate and regular rhythm.     Pulses: Normal pulses.     Heart sounds: Normal heart sounds.  Pulmonary:     Effort: Pulmonary effort is normal.     Breath sounds: Normal breath sounds.  Neurological:     General: No focal deficit present.     Mental Status: She is alert and oriented to person, place, and time.           Assessment & Plan:  Her HTN and migraines are stable, so we will refill Propranolol. For the nasal staph infection, treat with Bactroban ointment.  Alysia Penna, MD

## 2019-07-26 DIAGNOSIS — S83282A Other tear of lateral meniscus, current injury, left knee, initial encounter: Secondary | ICD-10-CM | POA: Diagnosis not present

## 2019-07-30 ENCOUNTER — Other Ambulatory Visit: Payer: BC Managed Care – PPO | Admitting: Orthotics

## 2019-08-02 DIAGNOSIS — M25562 Pain in left knee: Secondary | ICD-10-CM | POA: Diagnosis not present

## 2019-08-06 DIAGNOSIS — S83282D Other tear of lateral meniscus, current injury, left knee, subsequent encounter: Secondary | ICD-10-CM | POA: Diagnosis not present

## 2019-08-08 ENCOUNTER — Ambulatory Visit (INDEPENDENT_AMBULATORY_CARE_PROVIDER_SITE_OTHER): Payer: BC Managed Care – PPO | Admitting: Orthotics

## 2019-08-08 ENCOUNTER — Other Ambulatory Visit: Payer: Self-pay

## 2019-08-08 DIAGNOSIS — G600 Hereditary motor and sensory neuropathy: Secondary | ICD-10-CM

## 2019-08-08 DIAGNOSIS — Q667 Congenital pes cavus, unspecified foot: Secondary | ICD-10-CM

## 2019-08-08 DIAGNOSIS — M659 Synovitis and tenosynovitis, unspecified: Secondary | ICD-10-CM

## 2019-08-08 NOTE — Progress Notes (Signed)
Patient came in today for evaluation/assessment custom foot orthotics.  Patient presents foot pain and discomfort associated with plantar fasciitis.  Patient has noted pes cavus foot type with also rear foot varus deformity. ...Goal is to "bring ground up" with contoured arch support, and 4 degree valgus RF and FF posting.     

## 2019-08-29 ENCOUNTER — Encounter: Payer: BC Managed Care – PPO | Admitting: Orthotics

## 2019-09-09 ENCOUNTER — Encounter: Payer: BC Managed Care – PPO | Admitting: Orthotics

## 2019-09-30 DIAGNOSIS — N760 Acute vaginitis: Secondary | ICD-10-CM | POA: Diagnosis not present

## 2019-09-30 DIAGNOSIS — R895 Abnormal microbiological findings in specimens from other organs, systems and tissues: Secondary | ICD-10-CM | POA: Diagnosis not present

## 2019-10-09 DIAGNOSIS — S83282A Other tear of lateral meniscus, current injury, left knee, initial encounter: Secondary | ICD-10-CM | POA: Diagnosis not present

## 2019-10-09 DIAGNOSIS — S83002A Unspecified subluxation of left patella, initial encounter: Secondary | ICD-10-CM | POA: Diagnosis not present

## 2019-10-09 DIAGNOSIS — M2242 Chondromalacia patellae, left knee: Secondary | ICD-10-CM | POA: Diagnosis not present

## 2019-10-09 DIAGNOSIS — Y999 Unspecified external cause status: Secondary | ICD-10-CM | POA: Diagnosis not present

## 2019-10-09 DIAGNOSIS — G8918 Other acute postprocedural pain: Secondary | ICD-10-CM | POA: Diagnosis not present

## 2019-10-09 DIAGNOSIS — X58XXXA Exposure to other specified factors, initial encounter: Secondary | ICD-10-CM | POA: Diagnosis not present

## 2019-10-11 ENCOUNTER — Other Ambulatory Visit: Payer: Self-pay | Admitting: Podiatry

## 2019-10-17 DIAGNOSIS — S83002D Unspecified subluxation of left patella, subsequent encounter: Secondary | ICD-10-CM | POA: Diagnosis not present

## 2019-10-18 DIAGNOSIS — M25662 Stiffness of left knee, not elsewhere classified: Secondary | ICD-10-CM | POA: Diagnosis not present

## 2019-10-18 DIAGNOSIS — S83282D Other tear of lateral meniscus, current injury, left knee, subsequent encounter: Secondary | ICD-10-CM | POA: Diagnosis not present

## 2019-10-18 DIAGNOSIS — M6281 Muscle weakness (generalized): Secondary | ICD-10-CM | POA: Diagnosis not present

## 2019-10-24 ENCOUNTER — Telehealth (INDEPENDENT_AMBULATORY_CARE_PROVIDER_SITE_OTHER): Payer: BC Managed Care – PPO | Admitting: Adult Health

## 2019-10-24 ENCOUNTER — Encounter: Payer: Self-pay | Admitting: Adult Health

## 2019-10-24 VITALS — BP 114/74 | HR 93 | Temp 101.0°F | Ht 59.0 in | Wt 147.0 lb

## 2019-10-24 DIAGNOSIS — J069 Acute upper respiratory infection, unspecified: Secondary | ICD-10-CM | POA: Diagnosis not present

## 2019-10-24 NOTE — Progress Notes (Signed)
Virtual Visit via Video Note  I connected with Weda Baumgarner on 10/24/19 at  3:30 PM EDT by a video enabled telemedicine application and verified that I am speaking with the correct person using two identifiers.  Location patient: home Location provider:work or home office Persons participating in the virtual visit: patient, provider  I discussed the limitations of evaluation and management by telemedicine and the availability of in person appointments. The patient expressed understanding and agreed to proceed.   HPI: 39 year old female who is being evaluated today for an acute issue.  Her symptoms started 4 days ago with nasal congestion.  The next day she felt fine until morning around 9 AM where she developed a fever from 99.2 degrees to 101 degrees, chills, body aches, sinus pain and pressure, and rhinorrhea with yellowish-green discharge.  He does report that her young child was sick earlier last week with congestion.  She has been vaccinated against COVID-19   ROS: See pertinent positives and negatives per HPI.  Past Medical History:  Diagnosis Date  . Charcot Marie Tooth muscular atrophy   . Migraines   . Muscular dystrophy ([place])    at age 67   . Muscular dystrophy [facility])     Past Surgical History:  Procedure Laterality Date  . TUBAL LIGATION      Family History  Problem Relation Age of Onset  . Muscular dystrophy Mother   . Hyperlipidemia Mother   . Endometriosis Mother   . Migraines Father   . Nephrolithiasis Father   . Hypertension Father   . Arthritis Brother   . Aneurysm Maternal Uncle   . Stroke Maternal Grandmother   . Aneurysm Maternal Grandfather   . Migraines Paternal Grandmother   . Cancer Paternal Grandfather        lung       Current Outpatient Medications:  .  Ascorbic Acid (VITAMIN C) 1000 MG tablet, Take 1,000 mg by mouth daily., Disp: , Rfl:  .  BIOTIN PO, Take by mouth daily., Disp: , Rfl:  .  Estrogens, Conjugated (PREMARIN VA), Place  vaginally daily., Disp: , Rfl:  .  MAGNESIUM PO, Take by mouth 2 (two) times a day., Disp: , Rfl:  .  meloxicam (MOBIC) 15 MG tablet, TAKE 1 TABLET BY MOUTH ONCE A DAY, Disp: 30 tablet, Rfl: 1 .  mupirocin ointment (BACTROBAN) 2 %, Place 1 application into the nose 2 (two) times daily., Disp: 22 g, Rfl: 0 .  OVER THE COUNTER MEDICATION, Gaba, Disp: , Rfl:  .  OVER THE COUNTER MEDICATION, Maca powder, Disp: , Rfl:  .  propranolol (INDERAL) 40 MG tablet, Take 1 tablet (40 mg total) by mouth daily., Disp: 90 tablet, Rfl: 3 .  SUMAtriptan (IMITREX) 100 MG tablet, TAKE 1 TABLET BY MOUTH EVERY 2 HOURS AS NEEDED, Disp: 10 tablet, Rfl: 11  Current Facility-Administered Medications:  .  betamethasone acetate-betamethasone sodium phosphate (CELESTONE) injection 3 mg, 3 mg, Intramuscular, Once, Evans, Dorathy Daft, DPM  EXAM:  VITALS per patient if applicable:  GENERAL: alert, oriented, appears well and in no acute distress  HEENT: atraumatic, conjunttiva clear, no obvious abnormalities on inspection of external nose and ears  NECK: normal movements of the head and neck  LUNGS: on inspection no signs of respiratory distress, breathing rate appears normal, no obvious gross SOB, gasping or wheezing  CV: no obvious cyanosis  MS: moves all visible extremities without noticeable abnormality  PSYCH/NEURO: pleasant and cooperative, no obvious depression or anxiety, speech and thought  processing grossly intact  ASSESSMENT AND PLAN:  Discussed the following assessment and plan:  1. Upper respiratory tract infection, unspecified type -Symptoms concerning for COVID-19 infection vs sinusitis.  She was advised to get tested.  Testing locations were given.  Follow-up with PCP or myself if test results are negative and she is still experiencing symptoms  Dorothyann Peng, NP       I discussed the assessment and treatment plan with the patient. The patient was provided an opportunity to ask questions and all  were answered. The patient agreed with the plan and demonstrated an understanding of the instructions.   The patient was advised to call back or seek an in-person evaluation if the symptoms worsen or if the condition fails to improve as anticipated.   Dorothyann Peng, NP

## 2019-10-25 DIAGNOSIS — R0989 Other specified symptoms and signs involving the circulatory and respiratory systems: Secondary | ICD-10-CM | POA: Diagnosis not present

## 2019-10-25 DIAGNOSIS — R509 Fever, unspecified: Secondary | ICD-10-CM | POA: Diagnosis not present

## 2019-10-28 ENCOUNTER — Ambulatory Visit
Admission: EM | Admit: 2019-10-28 | Discharge: 2019-10-28 | Disposition: A | Discharge status: Home / Self Care | Payer: BC Managed Care – PPO | Attending: Emergency Medicine | Admitting: Emergency Medicine

## 2019-10-28 ENCOUNTER — Telehealth: Payer: Self-pay

## 2019-10-28 DIAGNOSIS — J0110 Acute frontal sinusitis, unspecified: Secondary | ICD-10-CM

## 2019-10-28 MED ORDER — AMOXICILLIN 875 MG PO TABS: 875.0000 mg | ORAL_TABLET | Freq: Two times a day (BID) | ORAL | 0 refills | Status: DC

## 2019-10-28 MED ORDER — AMOXICILLIN 875 MG PO TABS: 875.0000 mg | ORAL_TABLET | Freq: Two times a day (BID) | ORAL | 0 refills | Status: AC

## 2019-10-28 NOTE — ED Triage Notes (Signed)
Patient reports symptoms began about a week ago. Reports dry cough, nasal congestion, sinus pressure, and headache. Reports her PCP told her to come back tomorrow if not feeling better for abx but patient states she feels too bad and needs to get back to work. Patient had negative COVID test on 10/25/19, declines testing.

## 2019-10-28 NOTE — Telephone Encounter (Signed)
Patient called requesting medications be sent to the [facility] on [address].

## 2019-10-28 NOTE — ED Provider Notes (Signed)
Roderic Palau    CSN: [id number] Arrival date & time: 10/28/19  3295      History   Chief Complaint Chief Complaint  Patient presents with  . Sinus Problem  . Cough    HPI Kambra Azarya Oconnell is a 39 y.o. female.  Patient presents with 9-day history of sinus pressure, nasal congestion, sinus headache, nonproductive cough, fever. T-max 102 on 10/25/2019; no fever in the past 24 hours. She reports episodes of shortness of breath if she has extreme coughing. She denies rash, vomiting, diarrhea, or other symptoms. Treatment attempted at home with Zyrtec-D.  The history is provided by the patient.    Past Medical History:  Diagnosis Date  . Charcot Marie Tooth muscular atrophy   . Migraines    has gotten better since hysterectomy  . Muscular dystrophy ([place])    at age 81   . Muscular dystrophy [facility])     Patient Active Problem List   Diagnosis Date Noted  . HTN (hypertension) 07/23/2019  . Constipation 09/23/2016  . Oligomenorrhea 09/23/2016  . Environmental allergies 02/20/2015  . Shingles 06/16/2014  . Gout 11/04/2013  . Migraines 11/04/2013  . Left foot pain 10/08/2013  . Pes cavus, congenital 10/08/2013  . Premenstrual dysphoric syndrome 08/28/2011  . Anal or rectal pain 08/28/2011  . Charcot-Marie-Tooth disease   . Muscular dystrophy [facility])     Past Surgical History:  Procedure Laterality Date  . ABDOMINAL HYSTERECTOMY    . TUBAL LIGATION      OB History    Gravida  1   Para  1   Term  1   Preterm      AB      Living  1     SAB      TAB      Ectopic      Multiple      Live Births  1            Home Medications    Prior to Admission medications   Medication Sig Start Date End Date Taking? Authorizing Provider  amoxicillin (AMOXIL) 875 MG tablet Take 1 tablet (875 mg total) by mouth 2 (two) times daily for 7 days. 10/28/19 11/04/19  Sharion Balloon, NP  Ascorbic Acid (VITAMIN C) 1000 MG tablet Take 1,000 mg by mouth daily.     [provider]  BIOTIN PO Take by mouth daily.    [provider]  Estrogens, Conjugated (PREMARIN VA) Place vaginally daily.    [provider]  MAGNESIUM PO Take by mouth 2 (two) times a day.    [provider]  meloxicam (MOBIC) 15 MG tablet TAKE 1 TABLET BY MOUTH ONCE A DAY 10/14/19   Edrick Kins, DPM  mupirocin ointment (BACTROBAN) 2 % Place 1 application into the nose 2 (two) times daily. 07/23/19   Laurey Morale, MD  OVER THE COUNTER MEDICATION Gaba    [provider]  OVER THE COUNTER MEDICATION Maca powder    [provider]  propranolol (INDERAL) 40 MG tablet Take 1 tablet (40 mg total) by mouth daily. 07/23/19   Laurey Morale, MD  SUMAtriptan (IMITREX) 100 MG tablet TAKE 1 TABLET BY MOUTH EVERY 2 HOURS AS NEEDED 09/22/17   Laurey Morale, MD    Family History Family History  Problem Relation Age of Onset  . Muscular dystrophy Mother   . Hyperlipidemia Mother   . Endometriosis Mother   . Migraines Father   .  Nephrolithiasis Father   . Hypertension Father   . Arthritis Brother   . Aneurysm Maternal Uncle   . Stroke Maternal Grandmother   . Aneurysm Maternal Grandfather   . Migraines Paternal Grandmother   . Cancer Paternal Grandfather        lung    Social History Social History   Tobacco Use  . Smoking status: Never Smoker  . Smokeless tobacco: Never Used  Substance Use Topics  . Alcohol use: Yes    Alcohol/week: 2.0 standard drinks    Types: 2 Glasses of wine per week    Comment: occ  . Drug use: No     Allergies   Erythromycin, Metronidazole, Nitrofurantoin, and Sulfa antibiotics   Review of Systems Review of Systems  Constitutional: Negative for chills and fever.  HENT: Positive for congestion, postnasal drip, sinus pressure and sinus pain. Negative for ear pain and sore throat.   Eyes: Negative for pain and visual disturbance.  Respiratory: Positive for cough and shortness of breath.     Cardiovascular: Negative for chest pain and palpitations.  Gastrointestinal: Negative for abdominal pain, diarrhea and vomiting.  Genitourinary: Negative for dysuria and hematuria.  Musculoskeletal: Negative for arthralgias and back pain.  Skin: Negative for color change and rash.  Neurological: Negative for seizures and syncope.  All other systems reviewed and are negative.    Physical Exam Triage Vital Signs ED Triage Vitals  Enc Vitals Group     BP      Pulse      Resp      Temp      Temp src      SpO2      Weight      Height      Head Circumference      Peak Flow      Pain Score      Pain Loc      Pain Edu?      Excl. in [place]?    No data found.  Updated Vital Signs BP 113/74   Pulse 88   Temp 99 F (37.2 C)   Resp 13   LMP 04/22/2019   SpO2 95%   Visual Acuity Right Eye Distance:   Left Eye Distance:   Bilateral Distance:    Right Eye Near:   Left Eye Near:    Bilateral Near:     Physical Exam Vitals and nursing note reviewed.  Constitutional:      General: She is not in acute distress.    Appearance: She is well-developed. She is not ill-appearing.  HENT:     Head: Normocephalic and atraumatic.     Right Ear: Tympanic membrane normal.     Left Ear: Tympanic membrane normal.     Nose: Congestion present.     Mouth/Throat:     Mouth: Mucous membranes are moist.     Pharynx: Oropharynx is clear.  Eyes:     Conjunctiva/sclera: Conjunctivae normal.  Cardiovascular:     Rate and Rhythm: Normal rate and regular rhythm.     Heart sounds: No murmur heard.   Pulmonary:     Effort: Pulmonary effort is normal. No respiratory distress.     Breath sounds: Normal breath sounds. No wheezing or rhonchi.  Abdominal:     Palpations: Abdomen is soft.     Tenderness: There is no abdominal tenderness. There is no guarding or rebound.  Musculoskeletal:     Cervical back: Neck supple.  Skin:    General:  Skin is warm and dry.     Findings: No rash.   Neurological:     General: No focal deficit present.     Mental Status: She is alert and oriented to person, place, and time.  Psychiatric:        Mood and Affect: Mood normal.        Behavior: Behavior normal.      UC Treatments / Results  Labs (all labs ordered are listed, but only abnormal results are displayed) Labs Reviewed - No data to display  EKG   Radiology No results found.  Procedures Procedures (including critical care time)  Medications Ordered in UC Medications - No data to display  Initial Impression / Assessment and Plan / UC Course  I have reviewed the triage vital signs and the nursing notes.  Pertinent labs & imaging results that were available during my care of the patient were reviewed by me and considered in my medical decision making (see chart for details).   Acute frontal sinusitis. Treating with amoxicillin. Instructed patient to take Mucinex as needed for congestion. Instructed her to follow-up with her PCP if her symptoms are not improving. Patient agrees to plan of care.   Final Clinical Impressions(s) / UC Diagnoses   Final diagnoses:  Acute non-recurrent frontal sinusitis     Discharge Instructions     Take the amoxicillin as directed.    Follow up with your primary care provider if your symptoms are not improving.       ED Prescriptions    Medication Sig Dispense Auth. Provider   amoxicillin (AMOXIL) 875 MG tablet  (Status: Discontinued) Take 1 tablet (875 mg total) by mouth 2 (two) times daily for 7 days. 14 tablet Sharion Balloon, NP   amoxicillin (AMOXIL) 875 MG tablet Take 1 tablet (875 mg total) by mouth 2 (two) times daily for 7 days. 14 tablet Sharion Balloon, NP     PDMP not reviewed this encounter.   Sharion Balloon, NP 10/28/19 1027

## 2019-10-28 NOTE — Discharge Instructions (Signed)
Take the amoxicillin as directed.  Follow up with your primary care provider if your symptoms are not improving.   ° ° °

## 2019-11-07 DIAGNOSIS — M25662 Stiffness of left knee, not elsewhere classified: Secondary | ICD-10-CM | POA: Diagnosis not present

## 2019-11-07 DIAGNOSIS — S83282D Other tear of lateral meniscus, current injury, left knee, subsequent encounter: Secondary | ICD-10-CM | POA: Diagnosis not present

## 2019-11-07 DIAGNOSIS — M25562 Pain in left knee: Secondary | ICD-10-CM | POA: Diagnosis not present

## 2019-11-07 DIAGNOSIS — M6281 Muscle weakness (generalized): Secondary | ICD-10-CM | POA: Diagnosis not present

## 2019-11-15 ENCOUNTER — Encounter: Payer: Self-pay | Admitting: Adult Health

## 2019-11-15 ENCOUNTER — Telehealth (INDEPENDENT_AMBULATORY_CARE_PROVIDER_SITE_OTHER): Payer: BC Managed Care – PPO | Admitting: Adult Health

## 2019-11-15 DIAGNOSIS — J0101 Acute recurrent maxillary sinusitis: Secondary | ICD-10-CM

## 2019-11-15 MED ORDER — AMOXICILLIN-POT CLAVULANATE 875-125 MG PO TABS: 1.0000 | ORAL_TABLET | Freq: Two times a day (BID) | ORAL | 0 refills | Status: DC

## 2019-11-15 NOTE — Progress Notes (Signed)
Virtual Visit via Video Note  I connected with Nichole Mcneil on 11/15/19 at  3:30 PM EDT by a video enabled telemedicine application and verified that I am speaking with the correct person using two identifiers.  Location patient: home Location provider:work or home office Persons participating in the virtual visit: patient, provider  I discussed the limitations of evaluation and management by telemedicine and the availability of in person appointments. The patient expressed understanding and agreed to proceed.   HPI: 39 year old female who is being evaluated today for concern of recurrent sinusitis.  Her symptoms started approximately 5 days ago.  Symptoms include sinus pain and pressure below her eyes, nasal congestion, left ear pain, scratchy throat.  She denies fevers, chills, cough.  At the beginning of this month she was treated to urgent care for sinus infection with a course of Augmentin, she tolerated this medication well and did feel much better until this week.  prior to that she was seen by me via virtual visit and it was thought that her symptoms could be related to Covid, she was tested which tested negative.  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  Past Medical History:  Diagnosis Date   Charcot Lelan Pons Tooth muscular atrophy    Migraines    has gotten better since hysterectomy   Muscular dystrophy ([place])    at age 47    Muscular dystrophy [facility])     Past Surgical History:  Procedure Laterality Date   ABDOMINAL HYSTERECTOMY     TUBAL LIGATION      Family History  Problem Relation Age of Onset   Muscular dystrophy Mother    Hyperlipidemia Mother    Endometriosis Mother    Migraines Father    Nephrolithiasis Father     Hypertension Father    Arthritis Brother    Aneurysm Maternal Uncle    Stroke Maternal Grandmother    Aneurysm Maternal Grandfather    Migraines Paternal Grandmother    Cancer Paternal Grandfather        lung       Current Outpatient Medications:    Ascorbic Acid (VITAMIN C) 1000 MG tablet, Take 1,000 mg by mouth daily., Disp: , Rfl:    BIOTIN PO, Take by mouth daily., Disp: , Rfl:    Estrogens, Conjugated (PREMARIN VA), Place vaginally daily., Disp: , Rfl:    MAGNESIUM PO, Take by mouth 2 (two) times a day., Disp: , Rfl:    meloxicam (MOBIC) 15 MG tablet, TAKE 1 TABLET BY MOUTH ONCE A DAY, Disp: 30 tablet, Rfl: 1   mupirocin ointment (BACTROBAN) 2 %, Place 1 application into the nose 2 (two) times daily., Disp: 22 g, Rfl: 0   OVER THE COUNTER MEDICATION, Gaba, Disp: , Rfl:    OVER THE COUNTER MEDICATION, Maca powder, Disp: , Rfl:    propranolol (INDERAL) 40 MG tablet, Take 1 tablet (40 mg total) by mouth daily., Disp: 90 tablet, Rfl: 3   SUMAtriptan (IMITREX) 100 MG tablet, TAKE 1 TABLET BY MOUTH EVERY 2 HOURS AS NEEDED, Disp: 10 tablet, Rfl: 11  Current Facility-Administered Medications:    betamethasone acetate-betamethasone sodium phosphate (CELESTONE) injection 3 mg, 3 mg, Intramuscular, Once, Evans, Dorathy Daft, DPM  EXAM:  VITALS per patient if applicable:  GENERAL: alert, oriented, appears well and in no acute distress  HEENT: atraumatic, conjunttiva clear, no obvious abnormalities on inspection of external nose and ears  NECK: normal movements of the head and neck  LUNGS: on inspection no signs of respiratory distress, breathing rate appears normal, no obvious gross SOB, gasping or wheezing  CV: no obvious cyanosis  MS: moves all visible extremities without noticeable abnormality  PSYCH/NEURO: pleasant and cooperative, no obvious depression or anxiety, speech and thought processing grossly intact  ASSESSMENT AND PLAN:  Discussed the following  assessment and plan:  1. Acute recurrent maxillary sinusitis - amoxicillin-clavulanate (AUGMENTIN) 875-125 MG tablet; Take 1 tablet by mouth 2 (two) times daily.  Dispense: 20 tablet; Refill: 0 - Add Flonase - Follow up if no improvement in the next 2-3 days    I discussed the assessment and treatment plan with the patient. The patient was provided an opportunity to ask questions and all were answered. The patient agreed with the plan and demonstrated an understanding of the instructions.   The patient was advised to call back or seek an in-person evaluation if the symptoms worsen or if the condition fails to improve as anticipated.   Dorothyann Peng, NP

## 2019-11-27 IMAGING — DX DG CHEST 2V
2 series · 2 of 2 positions shown · non-contrast
Comparison: None in PACs

CLINICAL DATA: Midsternal chest pain associated with dizziness and
shortness of breath for the past 3 days. Nonsmoker.

EXAM:
CHEST  2 VIEW

[chest pa]
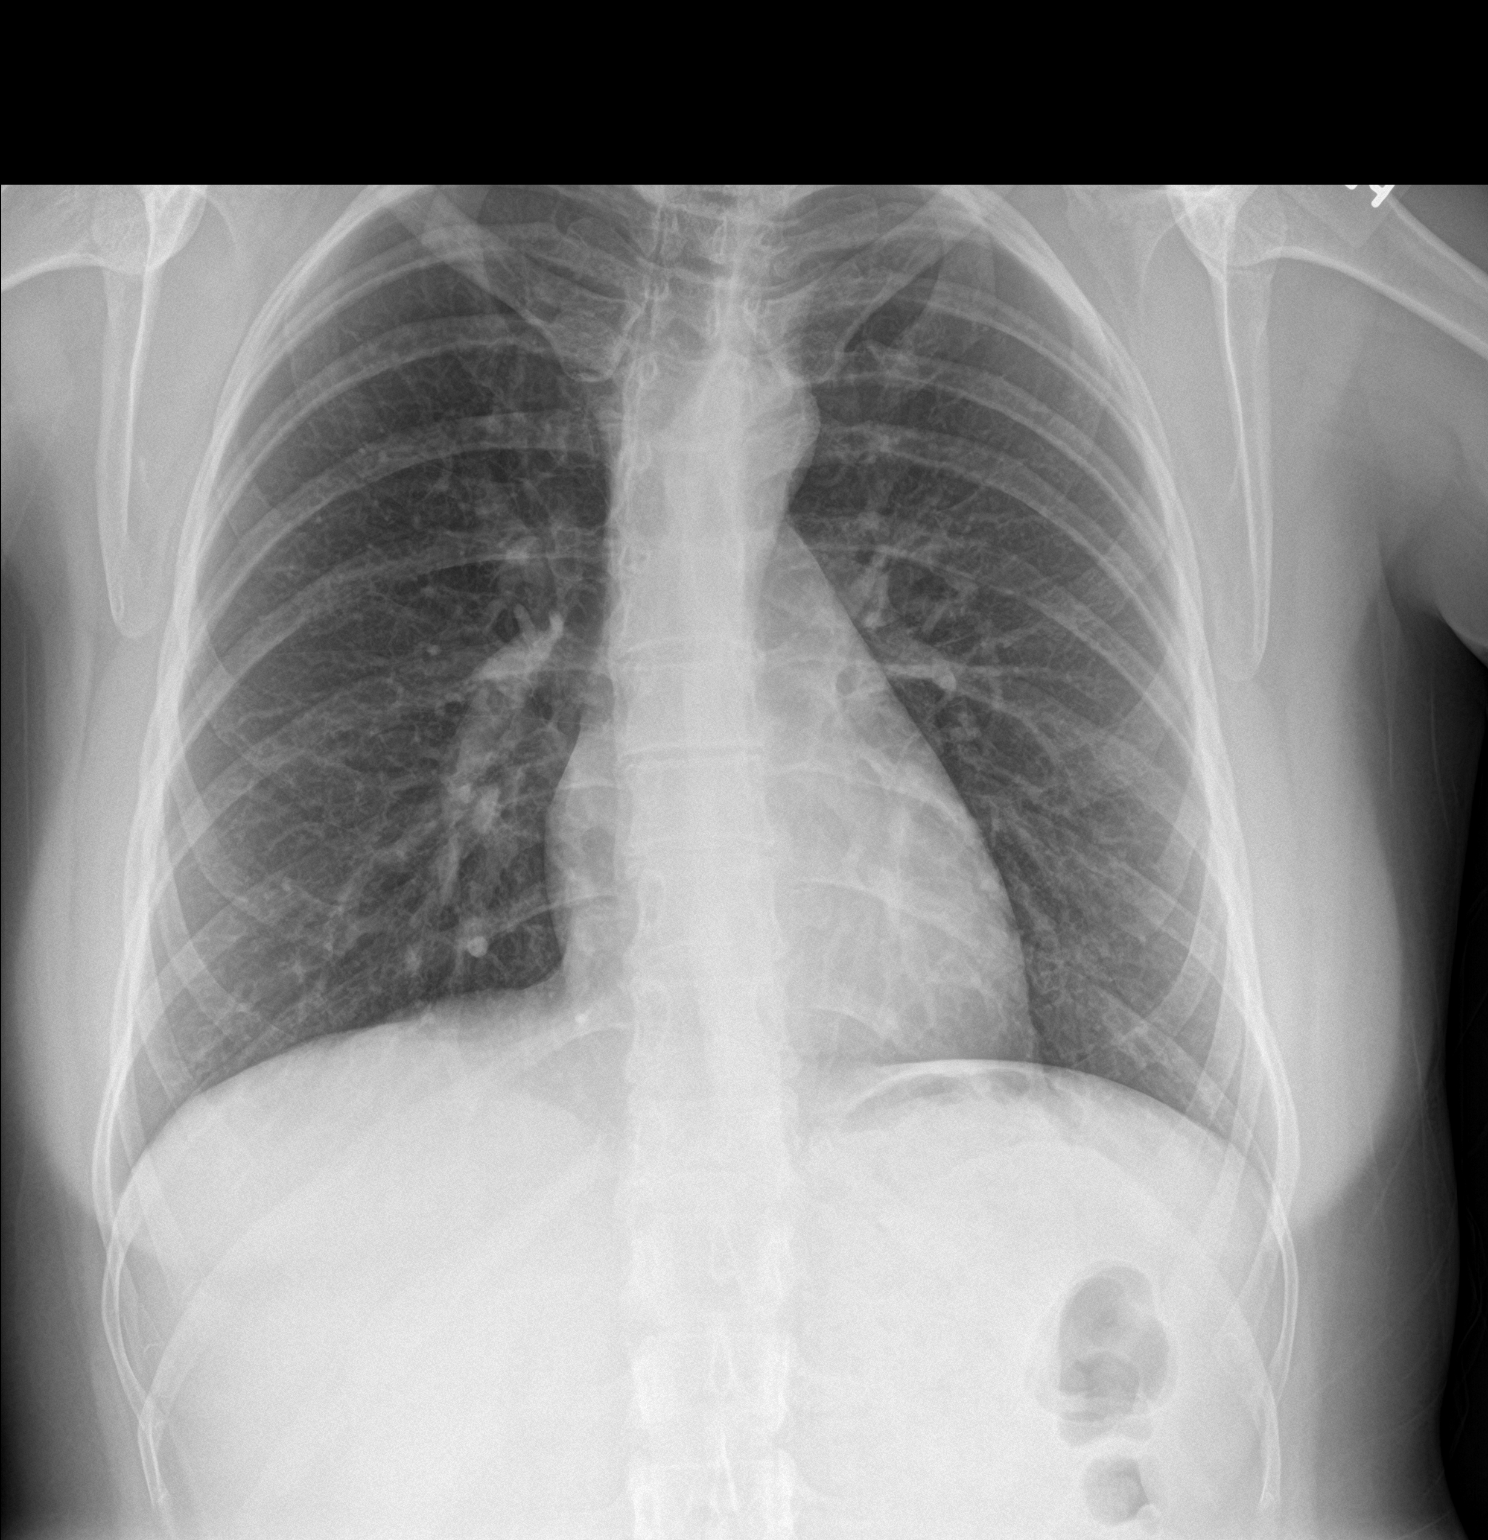

[chest lat]
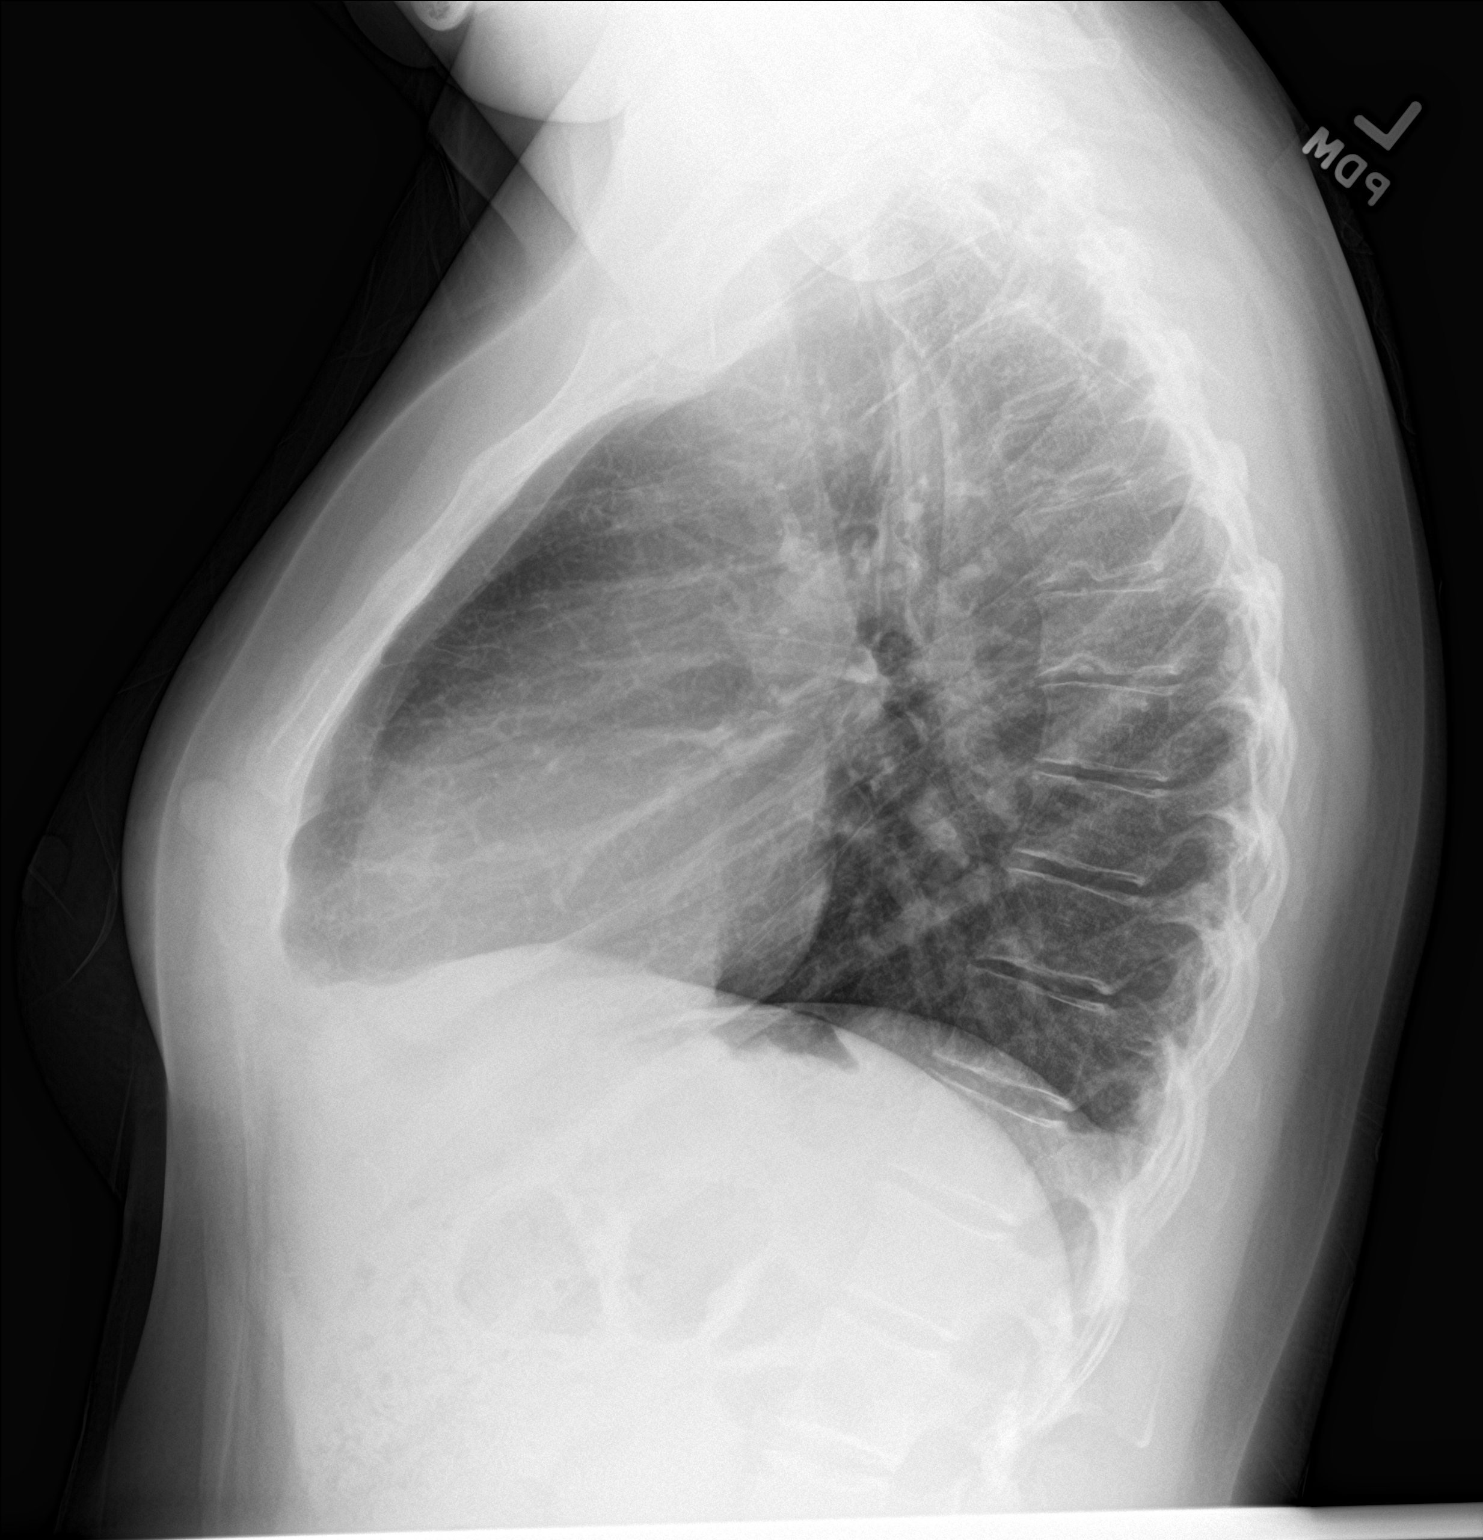

[2 of 2 positions shown; findings below may reference images not displayed]

FINDINGS: The lungs are adequately inflated and clear. The heart and pulmonary
vascularity are normal. The mediastinum is normal in width. There is
no pleural effusion. There is gentle curvature of the midthoracic
spine convex toward the right.
IMPRESSION: There is no active cardiopulmonary disease.

## 2019-12-17 ENCOUNTER — Other Ambulatory Visit: Payer: Self-pay | Admitting: Podiatry

## 2019-12-24 DIAGNOSIS — Z6829 Body mass index (BMI) 29.0-29.9, adult: Secondary | ICD-10-CM | POA: Diagnosis not present

## 2019-12-24 DIAGNOSIS — Z01419 Encounter for gynecological examination (general) (routine) without abnormal findings: Secondary | ICD-10-CM | POA: Diagnosis not present

## 2019-12-24 DIAGNOSIS — K439 Ventral hernia without obstruction or gangrene: Secondary | ICD-10-CM | POA: Diagnosis not present

## 2019-12-24 DIAGNOSIS — G4701 Insomnia due to medical condition: Secondary | ICD-10-CM | POA: Diagnosis not present

## 2020-01-07 DIAGNOSIS — M6208 Separation of muscle (nontraumatic), other site: Secondary | ICD-10-CM | POA: Diagnosis not present

## 2020-01-27 ENCOUNTER — Telehealth (INDEPENDENT_AMBULATORY_CARE_PROVIDER_SITE_OTHER): Payer: BC Managed Care – PPO | Admitting: Family Medicine

## 2020-01-27 ENCOUNTER — Telehealth: Payer: Self-pay | Admitting: Family Medicine

## 2020-01-27 ENCOUNTER — Encounter: Payer: Self-pay | Admitting: Family Medicine

## 2020-01-27 VITALS — Ht 59.0 in | Wt 147.0 lb

## 2020-01-27 DIAGNOSIS — J0191 Acute recurrent sinusitis, unspecified: Secondary | ICD-10-CM

## 2020-01-27 MED ORDER — METHYLPREDNISOLONE 4 MG PO TBPK: ORAL_TABLET | ORAL | 0 refills | Status: DC

## 2020-01-27 MED ORDER — FLUCONAZOLE 150 MG PO TABS: 150.0000 mg | ORAL_TABLET | Freq: Once | ORAL | 5 refills | Status: AC

## 2020-01-27 MED ORDER — LEVOFLOXACIN 500 MG PO TABS: 500.0000 mg | ORAL_TABLET | Freq: Every day | ORAL | 0 refills | Status: AC

## 2020-01-27 NOTE — Telephone Encounter (Signed)
Patient called back and stated that she just had a virtual visit and she wanted to see if provider can go ahead and send a prescription for prednisone to [place], [place]  Phone:  [phone] Fax:  [phone]  CB is [phone]

## 2020-01-27 NOTE — Telephone Encounter (Signed)
I sent in a Medrol dose pack  

## 2020-01-27 NOTE — Progress Notes (Signed)
   Subjective:    Patient ID: Nichole Mcneil, female    DOB: 13-Mar-1980, 39 y.o.   MRN: [id number]  HPI Virtual Visit via Telephone Note  I connected with the patient on 01/27/20 at  8:45 AM EST by telephone and verified that I am speaking with the correct person using two identifiers.   I discussed the limitations, risks, security and privacy concerns of performing an evaluation and management service by telephone and the availability of in person appointments. I also discussed with the patient that there may be a patient responsible charge related to this service. The patient expressed understanding and agreed to proceed.  Location patient: home Location provider: work or home office Participants present for the call: patient, provider Patient did not have a visit in the prior 7 days to address this/these issue(s).   History of Present Illness: Here for 4 weeks of sinus congestion, a dry cough, and blowing green mucus from the nose. No fever. No SOB or body aches or NVD. She went to an urgent care clinic 3 weeks ago and was given a 14 day course of Augmentin, which she finished one week ago. She felt a little better for awhile, but now she is sick again. She has taken Mucinex D, Zyrtec D, and Flonase.    Observations/Objective: Patient sounds cheerful and well on the phone. I do not appreciate any SOB. Speech and thought processing are grossly intact. Patient reported vitals:  Assessment and Plan: Recurrent sinusitis, treat with Levaquin for 10 days.  Alysia Penna, MD   Follow Up Instructions:     [phone] 11-20 [phone] 21-30 I did not refer this patient for an OV in the next 24 hours for this/these issue(s).  I discussed the assessment and treatment plan with the patient. The patient was provided an opportunity to ask questions and all were answered. The patient agreed with the plan and demonstrated an understanding of the instructions.   The patient was advised  to call back or seek an in-person evaluation if the symptoms worsen or if the condition fails to improve as anticipated.  I provided 12 minutes of non-face-to-face time during this encounter.   Alysia Penna, MD    Review of Systems     Objective:   Physical Exam        Assessment & Plan:

## 2020-01-27 NOTE — Telephone Encounter (Signed)
I left pt a voicemail letting her know that the rx was sent in & to let us know if she doesn't improve after completing it.

## 2020-02-19 DIAGNOSIS — K432 Incisional hernia without obstruction or gangrene: Secondary | ICD-10-CM | POA: Diagnosis not present

## 2020-04-14 ENCOUNTER — Telehealth: Payer: Self-pay | Admitting: Podiatry

## 2020-04-14 NOTE — Telephone Encounter (Signed)
Pt states she recently developed a hernia and the meloxicam is causing stomach issues. She would like to know if there is another medication she could take instead? Please advise.

## 2020-04-24 NOTE — Telephone Encounter (Signed)
Thank you :)

## 2020-04-24 NOTE — Telephone Encounter (Signed)
Patient has been waiting for over a week to get a call back from Dr. Amalia Hailey. Please Advise

## 2020-04-24 NOTE — Telephone Encounter (Signed)
Spoke with patient.  All questions answered.-Dr. Amalia Hailey

## 2020-05-09 ENCOUNTER — Other Ambulatory Visit: Payer: Self-pay | Admitting: Podiatry

## 2020-07-18 ENCOUNTER — Other Ambulatory Visit: Payer: Self-pay | Admitting: Podiatry

## 2020-07-24 ENCOUNTER — Encounter: Payer: Self-pay | Admitting: Family Medicine

## 2020-07-24 ENCOUNTER — Other Ambulatory Visit: Payer: Self-pay

## 2020-07-24 ENCOUNTER — Telehealth (INDEPENDENT_AMBULATORY_CARE_PROVIDER_SITE_OTHER): Payer: BC Managed Care – PPO | Admitting: Family Medicine

## 2020-07-24 DIAGNOSIS — J0191 Acute recurrent sinusitis, unspecified: Secondary | ICD-10-CM

## 2020-07-24 MED ORDER — AMOXICILLIN-POT CLAVULANATE 875-125 MG PO TABS: 1.0000 | ORAL_TABLET | Freq: Two times a day (BID) | ORAL | 0 refills | Status: DC

## 2020-07-24 NOTE — Progress Notes (Signed)
Subjective:    Patient ID: Nichole Mcneil, female    DOB: Feb 02, 1981, 40 y.o.   MRN: [id number]  HPI Virtual Visit via Video Note  I connected with the patient on 07/24/20 at  2:30 PM EDT by a video enabled telemedicine application and verified that I am speaking with the correct person using two identifiers.  Location patient: home Location provider:work or home office Persons participating in the virtual visit: patient, provider  I discussed the limitations of evaluation and management by telemedicine and the availability of in person appointments. The patient expressed understanding and agreed to proceed.   HPI: Here for 2 weeks of stuffy head, blowing yellow mucus from the nose, and a dry cough. No fever or body aches or NVD. She has tested negative twice for the Covid virus.    ROS: See pertinent positives and negatives per HPI.  Past Medical History:  Diagnosis Date  . Charcot Marie Tooth muscular atrophy   . Migraines    has gotten better since hysterectomy  . Muscular dystrophy ([place])    at age 56   . Muscular dystrophy [facility])     Past Surgical History:  Procedure Laterality Date  . ABDOMINAL HYSTERECTOMY    . TUBAL LIGATION      Family History  Problem Relation Age of Onset  . Muscular dystrophy Mother   . Hyperlipidemia Mother   . Endometriosis Mother   . Migraines Father   . Nephrolithiasis Father   . Hypertension Father   . Arthritis Brother   . Aneurysm Maternal Uncle   . Stroke Maternal Grandmother   . Aneurysm Maternal Grandfather   . Migraines Paternal Grandmother   . Cancer Paternal Grandfather        lung     Current Outpatient Medications:  .  amoxicillin-clavulanate (AUGMENTIN) 875-125 MG tablet, Take 1 tablet by mouth 2 (two) times daily., Disp: 20 tablet, Rfl: 0 .  Ascorbic Acid (VITAMIN C) 1000 MG tablet, Take 1,000 mg by mouth daily., Disp: , Rfl:  .  BIOTIN PO, Take by mouth daily., Disp: , Rfl:  .  Estrogens, Conjugated  (PREMARIN VA), Place vaginally daily., Disp: , Rfl:  .  MAGNESIUM PO, Take by mouth 2 (two) times a day., Disp: , Rfl:  .  meloxicam (MOBIC) 15 MG tablet, TAKE 1 TABLET BY MOUTH ONCE A DAY, Disp: 30 tablet, Rfl: 1 .  OVER THE COUNTER MEDICATION, Gaba, Disp: , Rfl:  .  OVER THE COUNTER MEDICATION, Maca powder, Disp: , Rfl:  .  propranolol (INDERAL) 40 MG tablet, Take 1 tablet (40 mg total) by mouth daily., Disp: 90 tablet, Rfl: 3  EXAM:  VITALS per patient if applicable:  GENERAL: alert, oriented, appears well and in no acute distress  HEENT: atraumatic, conjunttiva clear, no obvious abnormalities on inspection of external nose and ears  NECK: normal movements of the head and neck  LUNGS: on inspection no signs of respiratory distress, breathing rate appears normal, no obvious gross SOB, gasping or wheezing  CV: no obvious cyanosis  MS: moves all visible extremities without noticeable abnormality  PSYCH/NEURO: pleasant and cooperative, no obvious depression or anxiety, speech and thought processing grossly intact  ASSESSMENT AND PLAN: Sinusitis, treat with Augmentin.  Alysia Penna, MD  Discussed the following assessment and plan:  No diagnosis found.     I discussed the assessment and treatment plan with the patient. The patient was provided an opportunity to ask questions and all were answered. The patient  agreed with the plan and demonstrated an understanding of the instructions.   The patient was advised to call back or seek an in-person evaluation if the symptoms worsen or if the condition fails to improve as anticipated.     Review of Systems     Objective:   Physical Exam        Assessment & Plan:

## 2020-08-26 ENCOUNTER — Encounter: Payer: Self-pay | Admitting: Adult Health

## 2020-08-26 ENCOUNTER — Telehealth (INDEPENDENT_AMBULATORY_CARE_PROVIDER_SITE_OTHER): Payer: BC Managed Care – PPO | Admitting: Adult Health

## 2020-08-26 VITALS — HR 94 | Temp 100.1°F | Ht 59.0 in | Wt 131.0 lb

## 2020-08-26 DIAGNOSIS — U071 COVID-19: Secondary | ICD-10-CM

## 2020-08-26 DIAGNOSIS — R112 Nausea with vomiting, unspecified: Secondary | ICD-10-CM | POA: Diagnosis not present

## 2020-08-26 MED ORDER — ONDANSETRON HCL 4 MG PO TABS: 4.0000 mg | ORAL_TABLET | Freq: Three times a day (TID) | ORAL | 0 refills | Status: DC | PRN

## 2020-08-26 MED ORDER — MOLNUPIRAVIR EUA 200MG CAPSULE: 4.0000 | ORAL_CAPSULE | Freq: Two times a day (BID) | ORAL | 0 refills | Status: AC

## 2020-08-26 NOTE — Progress Notes (Signed)
Virtual Visit via Video Note  I connected with Nichole Mcneil on 08/26/20 at 10:00 AM EDT by a video enabled telemedicine application and verified that I am speaking with the correct person using two identifiers.  Location patient: home Location provider:work or home office Persons participating in the virtual visit: patient, provider  I discussed the limitations of evaluation and management by telemedicine and the availability of in person appointments. The patient expressed understanding and agreed to proceed.   HPI: 40 year old female who is being evaluated today for COVID-19 infection.  Symptoms started yesterday and she tested positive for COVID-19 at this time.  Her husband also tested positive and her 10-year-old son has symptoms but has not tested positive yet.  Her symptoms include nasal congestion, headaches, fever up to 100.1 despite taking Tylenol/Motrin, chills, body aches, nausea and vomiting, and tightness in her chest.  He denies shortness of breath or wheezing.   ROS: See pertinent positives and negatives per HPI.  Past Medical History:  Diagnosis Date   Charcot Lelan Pons Tooth muscular atrophy    Migraines    has gotten better since hysterectomy   Muscular dystrophy ([place])    at age 87    Muscular dystrophy [facility])     Past Surgical History:  Procedure Laterality Date   ABDOMINAL HYSTERECTOMY     TUBAL LIGATION      Family History  Problem Relation Age of Onset   Muscular dystrophy Mother    Hyperlipidemia Mother    Endometriosis Mother    Migraines Father    Nephrolithiasis Father    Hypertension Father    Arthritis Brother    Aneurysm Maternal Uncle    Stroke Maternal Grandmother    Aneurysm Maternal Grandfather    Migraines Paternal Grandmother    Cancer Paternal Grandfather        lung       Current Outpatient Medications:    amoxicillin-clavulanate (AUGMENTIN) 875-125 MG tablet, Take 1 tablet by mouth 2 (two) times daily., Disp: 20 tablet, Rfl: 0    Ascorbic Acid (VITAMIN C) 1000 MG tablet, Take 1,000 mg by mouth daily., Disp: , Rfl:    BIOTIN PO, Take by mouth daily., Disp: , Rfl:    Estrogens, Conjugated (PREMARIN VA), Place vaginally daily., Disp: , Rfl:    MAGNESIUM PO, Take by mouth 2 (two) times a day., Disp: , Rfl:    meloxicam (MOBIC) 15 MG tablet, TAKE 1 TABLET BY MOUTH ONCE A DAY, Disp: 30 tablet, Rfl: 1   OVER THE COUNTER MEDICATION, Gaba, Disp: , Rfl:    OVER THE COUNTER MEDICATION, Maca powder, Disp: , Rfl:    propranolol (INDERAL) 40 MG tablet, Take 1 tablet (40 mg total) by mouth daily., Disp: 90 tablet, Rfl: 3  EXAM:  VITALS per patient if applicable:  GENERAL: alert, oriented, appears well and in no acute distress  HEENT: atraumatic, conjunttiva clear, no obvious abnormalities on inspection of external nose and ears  NECK: normal movements of the head and neck  LUNGS: on inspection no signs of respiratory distress, breathing rate appears normal, no obvious gross SOB, gasping or wheezing  CV: no obvious cyanosis  MS: moves all visible extremities without noticeable abnormality  PSYCH/NEURO: pleasant and cooperative, no obvious depression or anxiety, speech and thought processing grossly intact  ASSESSMENT AND PLAN:  Discussed the following assessment and plan:  1. COVID-19 virus infection -Discussed antiviral treatment, and patient would like to start antiviral treatment to help with her symptoms of COVID-19.  Not had labs in roughly 2 years, will forego treatment with Paxil of it at this time. - molnupiravir EUA 200 mg CAPS; Take 4 capsules (800 mg total) by mouth 2 (two) times daily for 5 days.  Dispense: 40 capsule; Refill: 0  2. Nausea and vomiting, intractability of vomiting not specified, unspecified vomiting type  - ondansetron (ZOFRAN) 4 MG tablet; Take 1 tablet (4 mg total) by mouth every 8 (eight) hours as needed for nausea or vomiting.  Dispense: 20 tablet; Refill: 0      I discussed the  assessment and treatment plan with the patient. The patient was provided an opportunity to ask questions and all were answered. The patient agreed with the plan and demonstrated an understanding of the instructions.   The patient was advised to call back or seek an in-person evaluation if the symptoms worsen or if the condition fails to improve as anticipated.   Dorothyann Peng, NP

## 2020-09-22 ENCOUNTER — Other Ambulatory Visit: Payer: Self-pay | Admitting: Family Medicine

## 2020-09-22 ENCOUNTER — Other Ambulatory Visit: Payer: Self-pay | Admitting: Podiatry

## 2020-09-24 DIAGNOSIS — K432 Incisional hernia without obstruction or gangrene: Secondary | ICD-10-CM | POA: Diagnosis not present

## 2020-09-24 DIAGNOSIS — Z90710 Acquired absence of both cervix and uterus: Secondary | ICD-10-CM | POA: Diagnosis not present

## 2020-09-24 DIAGNOSIS — G600 Hereditary motor and sensory neuropathy: Secondary | ICD-10-CM | POA: Diagnosis not present

## 2020-10-08 DIAGNOSIS — K439 Ventral hernia without obstruction or gangrene: Secondary | ICD-10-CM | POA: Diagnosis not present

## 2020-10-08 DIAGNOSIS — K432 Incisional hernia without obstruction or gangrene: Secondary | ICD-10-CM | POA: Diagnosis not present

## 2020-10-08 DIAGNOSIS — N289 Disorder of kidney and ureter, unspecified: Secondary | ICD-10-CM | POA: Diagnosis not present

## 2020-10-12 ENCOUNTER — Encounter: Payer: Self-pay | Admitting: Family Medicine

## 2020-10-13 NOTE — Progress Notes (Signed)
10/14/20 3:15 PM   Gabriel Cirri Kazlynn Hibberd 1980/11/12 [id number]  Referring provider:  Laurey Morale, MD [place],  [place] [id number] Chief Complaint  Patient presents with   Other     HPI: Nichole Mcneil is a 40 y.o.female who presents today for an abnormal CT scan.   She was seen at [facility] on 10/08/2020 and underwent a CT abdomen and pelvis without contrast that showed indeterminate inferior pole left renal lesions which are not fully characterized on this noncontrast CT. These may represent hemorrhagic cysts. Recommend dedicated follow-up with CT renal cyst protocol (without and with contrast).  On 10/2013 CT scan showed she had Small left renal cysts, measuring up to 14 mm in the lateral left lower pole. Right kidney was within normal limits. No hydronephrosis.   Urinalysis is unremarkable other than ketones.   She denies any modifying or aggravating factors today.  She denies any gross hematuria, dysuria or suprapubic/flank pain.  She has been having discomfort in her left hip as well as pain radiating underneath her left breastbone.   She is scheduled for hernia repair in October at [facility].  She is also having potential gallbladder issues.  PMH: Past Medical History:  Diagnosis Date   Charcot Lelan Pons Tooth muscular atrophy    Migraines    has gotten better since hysterectomy   Muscular dystrophy ([place])    at age 85    Muscular dystrophy [facility] [place])     Surgical History: Past Surgical History:  Procedure Laterality Date   ABDOMINAL HYSTERECTOMY     TUBAL LIGATION      Home Medications:  Allergies as of 10/14/2020       Reactions   Erythromycin    Metronidazole Other (See Comments)   May aggravate neuropathy   Nitrofurantoin    May aggravate neuropathy   Sulfa Antibiotics         Medication List        Accurate as of October 14, 2020  3:15 PM. If you have any questions, ask your nurse or doctor.          STOP taking these medications     amoxicillin-clavulanate 875-125 MG tablet Commonly known as: AUGMENTIN Stopped by: Hollice Espy, MD       TAKE these medications    BIOTIN PO Take by mouth daily.   MAGNESIUM PO Take by mouth 2 (two) times a day.   meloxicam 15 MG tablet Commonly known as: MOBIC TAKE 1 TABLET BY MOUTH ONCE A DAY   ondansetron 4 MG tablet Commonly known as: Zofran Take 1 tablet (4 mg total) by mouth every 8 (eight) hours as needed for nausea or vomiting.   OVER THE COUNTER MEDICATION Gaba   OVER THE COUNTER MEDICATION Maca powder   PREMARIN VA Place vaginally daily.   progesterone 100 MG capsule Commonly known as: PROMETRIUM Take 100 mg by mouth daily as needed.   propranolol 40 MG tablet Commonly known as: INDERAL TAKE 1 TABLET BY MOUTH ONCE A DAY   vitamin C 1000 MG tablet Take 1,000 mg by mouth daily.        Allergies:  Allergies  Allergen Reactions   Erythromycin    Metronidazole Other (See Comments)    May aggravate neuropathy   Nitrofurantoin     May aggravate neuropathy   Sulfa Antibiotics     Family History: Family History  Problem Relation Age of Onset   Muscular dystrophy Mother    Hyperlipidemia Mother  Endometriosis Mother    Migraines Father    Nephrolithiasis Father    Hypertension Father    Arthritis Brother    Aneurysm Maternal Uncle    Stroke Maternal Grandmother    Aneurysm Maternal Grandfather    Migraines Paternal Grandmother    Cancer Paternal Grandfather        lung    Social History:  reports that she has never smoked. She has never used smokeless tobacco. She reports current alcohol use of about 2.0 standard drinks per week. She reports that she does not use drugs.   Physical Exam: BP 129/82   Pulse 79   Ht '4\' 11"'$  (1.499 m)   Wt 130 lb (59 kg)   LMP 04/22/2019   BMI 26.26 kg/m   Constitutional:  Alert and oriented, No acute distress. HEENT: [place] AT, moist mucus membranes.  Trachea midline, no masses. Cardiovascular:  No clubbing, cyanosis, or edema. Respiratory: Normal respiratory effort, no increased work of breathing. Skin: No rashes, bruises or suspicious lesions. Neurologic: Grossly intact, no focal deficits, moving all 4 extremities. Psychiatric: Normal mood and affect.  Laboratory Data:  Lab Results  Component Value Date   CREATININE 0.69 07/13/2018     Urinalysis Unremarkable with ketones   Pertinent Imaging: CT abdomen and pelvis without IV contrast  Comparison: None available.  Indication: Hernia, complicated, 123456 Incisional hernia without obstruction or gangrene.  Technique:  CT imaging of the abdomen and pelvis was performed without intravenous contrast. Coronal and sagittal reformatted images were generated and reviewed.  Findings:  Limited assessment given the lack of IV contrast, particularly of the solid organs, bowel, and vasculature.  - Lower Thorax: No suspicious pulmonary abnormalities. No pleural or pericardial effusions.  - Liver: Unremarkable noncontrast appearance.   - Biliary and Gallbladder: No intrahepatic or extrahepatic bile duct dilatation. Probable cholelithiasis  - Spleen: Normal in size.    - Pancreas: Unremarkable noncontrast appearance.   - Adrenal Glands: Normal.   - Kidneys: Indeterminate inferior pole left renal lesions, 9 and 12 mm  - Abdominal and Pelvic Vasculature: No abdominal aortic aneurysm.  - Gastrointestinal Tract: No evidence of bowel obstruction.  - Peritoneum/Mesentery/Retroperitoneum: No free fluid.  No free intraperitoneal air.  - Lymph Nodes: No retroperitoneal, mesenteric, or pelvic lymphadenopathy.    - Bladder: Normal in appearance.  - Pelvic Organs: Unremarkable.  - Body Wall: Wide necked ventral abdominal hernia containing fat, through patient's diastases recti 37m in transverse dimension. Hernia sac starts 7 cm below the umbilicus, and extends 5 cm craniocaudally. Small fatty umbilical hernia.  -  Musculoskeletal:  No aggressive appearing osseous lesions.   Impression:  1.  Fat containing ventral abdominal hernias, as above. 2.  Indeterminate inferior pole left renal lesions which are not fully characterized on this noncontrast CT. These may represent hemorrhagic cysts. Recommend dedicated follow-up with CT renal cyst protocol (without and with contrast).  Electronically Reviewed by:  WAviva Signs MD, [facility] Electronically Reviewed on:  10/08/2020 9:06 AM   I was not able to personally reviewed the images     CLINICAL DATA:  Pelvic pain, vaginal bleeding, lightheaded. Essure  placement on 09/25/2013. Foreign body expelled this morning.    EXAM:  CT ABDOMEN AND PELVIS WITH CONTRAST    TECHNIQUE:  Multidetector CT imaging of the abdomen and pelvis was performed  using the standard protocol following bolus administration of  intravenous contrast.    CONTRAST:  100 mL Isovue 300 IV    COMPARISON:  None.  FINDINGS:  Lower chest:  Lung bases are clear.    Hepatobiliary: Liver is within normal limits.    Gallbladder is unremarkable. No intrahepatic or extrahepatic ductal  dilatation.    Spleen: Within normal limits.    Pancreas: Within normal limits.    Stomach/Bowel: Stomach is unremarkable.    No evidence of bowel obstruction.    Normal appendix.    Adrenals/urinary tract: Adrenal glands unremarkable.    Small left renal cysts, measuring up to 14 mm in the lateral left  lower pole (series 2/ image 32). Right kidney is within normal  limits. No hydronephrosis.    Bladder is within normal limits.    Vascular/Lymphatic: No evidence of abdominal aortic aneurysm.    No suspicious abdominopelvic lymphadenopathy.    Reproductive: Uterus is notable for a left Essure device centered at  the uterotubal junction, likely in satisfactory position. Right  Essure device is not visualized.    Bilateral ovaries are within normal limits.     Musculoskeletal: Visualized osseous structures are within normal  limits.    Other: Trace pelvic ascites, simple.    No free air.    IMPRESSION:  Left Essure device in satisfactory position. Right Essure device is  not visualized, compatible with clinical history of expulsion.    Trace pelvic ascites, simple.  No free air.    These results will be called to the ordering clinician or  representative by the Radiology Department at the imaging location.      Electronically Signed    By: Julian Hy M.D.    On: 11/06/2013 14:06   I have personally reviewed the images and agree with radiologist interpretation.    Assessment & Plan:    Left renal cyst  - previously consist with renal cyst of left side in 2015, similar size and location and thus the aforementioned lesion is probably the same cyst seen on previous scans  - follow-up with RUS will call with results   Follow-up as needed  I,Kailey Littlejohn,acting as a scribe for Hollice Espy, MD.,have documented all relevant documentation on the behalf of Hollice Espy, MD,as directed by  Hollice Espy, MD while in the presence of Hollice Espy, MD.  I have reviewed the above documentation for accuracy and completeness, and I agree with the above.   Hollice Espy, MD   [facility] [address], [place], [place] [address] [phone]

## 2020-10-14 ENCOUNTER — Other Ambulatory Visit: Payer: Self-pay

## 2020-10-14 ENCOUNTER — Encounter: Payer: Self-pay | Admitting: Urology

## 2020-10-14 ENCOUNTER — Ambulatory Visit (INDEPENDENT_AMBULATORY_CARE_PROVIDER_SITE_OTHER): Payer: BC Managed Care – PPO | Admitting: Urology

## 2020-10-14 VITALS — BP 129/82 | HR 79 | Ht 59.0 in | Wt 130.0 lb

## 2020-10-14 DIAGNOSIS — N289 Disorder of kidney and ureter, unspecified: Secondary | ICD-10-CM | POA: Diagnosis not present

## 2020-10-15 LAB — URINALYSIS, COMPLETE
Bilirubin, UA: NEGATIVE
Glucose, UA: NEGATIVE
Leukocytes,UA: NEGATIVE
Nitrite, UA: NEGATIVE
Protein,UA: NEGATIVE
RBC, UA: NEGATIVE
Specific Gravity, UA: 1.015 (ref 1.005–1.030)
Urobilinogen, Ur: 0.2 mg/dL (ref 0.2–1.0)
pH, UA: 7 (ref 5.0–7.5)

## 2020-10-15 LAB — MICROSCOPIC EXAMINATION
Bacteria, UA: NONE SEEN
Epithelial Cells (non renal): NONE SEEN /hpf (ref 0–10)
RBC, Urine: NONE SEEN /hpf (ref 0–2)
WBC, UA: NONE SEEN /hpf (ref 0–5)

## 2020-10-15 NOTE — Telephone Encounter (Signed)
Set up an OV with me so we can discuss these things

## 2020-10-16 ENCOUNTER — Encounter: Payer: Self-pay | Admitting: Family Medicine

## 2020-10-16 ENCOUNTER — Other Ambulatory Visit: Payer: Self-pay

## 2020-10-16 ENCOUNTER — Ambulatory Visit (INDEPENDENT_AMBULATORY_CARE_PROVIDER_SITE_OTHER): Payer: BC Managed Care – PPO | Admitting: Family Medicine

## 2020-10-16 VITALS — BP 128/78 | HR 61 | Temp 98.4°F | Wt 130.4 lb

## 2020-10-16 DIAGNOSIS — K432 Incisional hernia without obstruction or gangrene: Secondary | ICD-10-CM | POA: Insufficient documentation

## 2020-10-16 DIAGNOSIS — R935 Abnormal findings on diagnostic imaging of other abdominal regions, including retroperitoneum: Secondary | ICD-10-CM | POA: Diagnosis not present

## 2020-10-16 DIAGNOSIS — R1011 Right upper quadrant pain: Secondary | ICD-10-CM

## 2020-10-16 DIAGNOSIS — K829 Disease of gallbladder, unspecified: Secondary | ICD-10-CM

## 2020-10-16 NOTE — Progress Notes (Signed)
   Subjective:    Patient ID: Nichole Mcneil, female    DOB: [birth year], 40 y.o.   MRN: [id number]  HPI Here with questions about her gall bladder and her incisional hernia. She had a TAH and BSO some years ago, and about a year ago she developed a painful lump in the lower abdomen in this area. She saw someone at [facility], and she was diagnosed with an incisional hernia. Surgery was recommended, but [name] had a bad experience there so she consulted Dr. Hollice Espy of [facility]. She had a non-contrasted CT of the abdomen and pelvis on 10-08-20 at [facility], and this showed the hernia. It also showed "questionable gallstones" as well as two lesions in the inferior pole of the left kidney measuring 9 mm and 12 mm. She was referred to [facility], and they have ordered a renal US to evaluate these lesions. She was referred to [place] for the gallstones. When I asked about possible symptoms, she admits to having intermittent pains in the RUQ of the abdomen that radiate around the right flank to the back for the past 8 months. These are never severe. They last several hours and then go away. She denies any nausea or vomiting or fever. She has also had intermittent watery diarrhea during these same 8 months. She has been on a restricted carbohydrate diet which is also low in fats. No urinary symptoms.    Review of Systems  Constitutional: Negative.   Respiratory: Negative.    Cardiovascular: Negative.   Gastrointestinal:  Positive for abdominal pain and diarrhea. Negative for abdominal distention, anal bleeding, blood in stool, constipation, nausea, rectal pain and vomiting.  Genitourinary:  Positive for flank pain. Negative for difficulty urinating, dysuria, frequency, hematuria and urgency.      Objective:   Physical Exam Constitutional:      General: She is not in acute distress.    Appearance: Normal appearance.  Eyes:     Conjunctiva/sclera: Conjunctivae normal.   Cardiovascular:     Rate and Rhythm: Normal rate and regular rhythm.     Pulses: Normal pulses.     Heart sounds: Normal heart sounds.  Pulmonary:     Effort: Pulmonary effort is normal.     Breath sounds: Normal breath sounds.  Abdominal:     General: Abdomen is flat. Bowel sounds are normal. There is no distension.     Palpations: Abdomen is soft. There is no mass.     Tenderness: There is no guarding or rebound.     Comments: She is quite tender in the epigastrium and the RUQ. She has a small slightly tender lump in the lower abdomen beneath a hysterectomy scar   Neurological:     Mental Status: She is alert.          Assessment & Plan:  She likely has symptomatic gallstones. We will set her up for a liver and gall bladder US next week. If this shows gallstones, we will refer her to Surgery. Otherwise she will need a surgery to repair the incisional hernia as well. She will follow up with Urology for the renal lesions. We spent 35 minutes reviewing records and discussing these issues.  Alysia Penna, MD   Alysia Penna, MD

## 2020-10-30 ENCOUNTER — Ambulatory Visit
Admission: RE | Admit: 2020-10-30 | Discharge: 2020-10-30 | Disposition: A | Discharge status: Home / Self Care | Payer: BC Managed Care – PPO | Source: Ambulatory Visit | Attending: Family Medicine | Admitting: Family Medicine

## 2020-10-30 ENCOUNTER — Ambulatory Visit
Admission: RE | Admit: 2020-10-30 | Discharge: 2020-10-30 | Disposition: A | Discharge status: Home / Self Care | Payer: BC Managed Care – PPO | Source: Ambulatory Visit | Attending: Urology | Admitting: Urology

## 2020-10-30 ENCOUNTER — Other Ambulatory Visit: Payer: Self-pay

## 2020-10-30 DIAGNOSIS — N289 Disorder of kidney and ureter, unspecified: Secondary | ICD-10-CM | POA: Diagnosis not present

## 2020-10-30 DIAGNOSIS — N281 Cyst of kidney, acquired: Secondary | ICD-10-CM | POA: Diagnosis not present

## 2020-10-30 DIAGNOSIS — R1011 Right upper quadrant pain: Secondary | ICD-10-CM | POA: Insufficient documentation

## 2020-10-30 DIAGNOSIS — N261 Atrophy of kidney (terminal): Secondary | ICD-10-CM | POA: Diagnosis not present

## 2020-10-30 DIAGNOSIS — N2889 Other specified disorders of kidney and ureter: Secondary | ICD-10-CM | POA: Diagnosis not present

## 2020-11-03 ENCOUNTER — Other Ambulatory Visit: Payer: Self-pay

## 2020-11-03 DIAGNOSIS — N289 Disorder of kidney and ureter, unspecified: Secondary | ICD-10-CM

## 2020-11-03 NOTE — Progress Notes (Signed)
Hollice Espy, MD  Alvera Novel, CMA I personally called the patient to discuss these findings with her, right-sided 8 mm lesion not previously appreciated, likely benign but needs to be followed.  We discussed renal ultrasound again in a year for surveillance.  She is agreeable this plan.  We also discussed whether or not to continue estrogen which she will continue for now but if it grows rapidly, may have to revisit this.   Please schedule her follow-up with me in a year with renal ultrasound.     As per Dr. Erlene Quan message, appointment made and RUS ordered.

## 2020-11-03 NOTE — Addendum Note (Signed)
Addended by: Alysia Penna A on: 11/03/2020 07:33 AM   Modules accepted: Orders

## 2020-11-04 ENCOUNTER — Encounter: Payer: Self-pay | Admitting: Family Medicine

## 2020-11-04 NOTE — Telephone Encounter (Signed)
In case this is from reflux, I agree with giving him the Pepcid. Keep doing this. The process in his throat may be from allergies, so I think he might benefit from a course of Prednisone. See if Mom agrees

## 2020-11-05 ENCOUNTER — Telehealth: Payer: Self-pay | Admitting: Family Medicine

## 2020-11-05 NOTE — Telephone Encounter (Signed)
Pt call and stated she want to agree to try [name] on the prednisone and would like a call back pt stated she want it sent to  [place], [place] Phone:  [phone]  Fax:  [phone]

## 2020-11-05 NOTE — Telephone Encounter (Signed)
Tell her that I just sent in for Prednisone solution to the pharmacy for [facility]. This will be BID for 5 days

## 2020-11-05 NOTE — Telephone Encounter (Signed)
Patient is Nichole Mcneil, please advise

## 2020-11-05 NOTE — Telephone Encounter (Signed)
Message already responded, pt mother aware that Rx prednisone was sent to her pharamcy

## 2020-11-05 NOTE — Telephone Encounter (Signed)
Pt mother was notified and verbalized understanding

## 2020-11-09 ENCOUNTER — Ambulatory Visit
Admission: RE | Admit: 2020-11-09 | Discharge: 2020-11-09 | Disposition: A | Discharge status: Home / Self Care | Payer: Self-pay | Source: Ambulatory Visit | Attending: Surgery | Admitting: Surgery

## 2020-11-09 ENCOUNTER — Other Ambulatory Visit: Payer: Self-pay

## 2020-11-09 DIAGNOSIS — K432 Incisional hernia without obstruction or gangrene: Secondary | ICD-10-CM

## 2020-11-12 ENCOUNTER — Ambulatory Visit (INDEPENDENT_AMBULATORY_CARE_PROVIDER_SITE_OTHER): Payer: BC Managed Care – PPO | Admitting: Surgery

## 2020-11-12 ENCOUNTER — Encounter: Payer: Self-pay | Admitting: Surgery

## 2020-11-12 ENCOUNTER — Other Ambulatory Visit: Payer: Self-pay

## 2020-11-12 VITALS — BP 121/82 | HR 64 | Temp 98.0°F | Ht 59.0 in | Wt 127.6 lb

## 2020-11-12 DIAGNOSIS — K432 Incisional hernia without obstruction or gangrene: Secondary | ICD-10-CM

## 2020-11-12 DIAGNOSIS — K811 Chronic cholecystitis: Secondary | ICD-10-CM

## 2020-11-12 NOTE — Patient Instructions (Addendum)
Our surgery scheduler will call you within 24-48 hours to schedule your surgery. Please have the [place] surgery sheet available when speaking with her.     Gallbladder Eating Plan If you have a gallbladder condition, you may have trouble digesting fats. Eating a low-fat diet can help reduce your symptoms, and may be helpful before and after having surgery to remove your gallbladder (cholecystectomy). Your health care provider may recommend that you work with a diet and nutrition specialist (dietitian) to help you reduce the amount of fat in your diet. What are tips for following this plan? General guidelines Limit your fat intake to less than 30% of your total daily calories. If you eat around 1,800 calories each day, this is less than 60 grams (g) of fat per day. Fat is an important part of a healthy diet. Eating a low-fat diet can make it hard to maintain a healthy body weight. Ask your dietitian how much fat, calories, and other nutrients you need each day. Eat small, frequent meals throughout the day instead of three large meals. Drink at least 8-10 cups of fluid a day. Drink enough fluid to keep your urine clear or pale yellow. Limit alcohol intake to no more than 1 drink a day for nonpregnant women and 2 drinks a day for men. One drink equals 12 oz of beer, 5 oz of wine, or 1 oz of hard liquor. Reading food labels  Check Nutrition Facts on food labels for the amount of fat per serving. Choose foods with less than 3 grams of fat per serving. Shopping Choose nonfat and low-fat healthy foods. Look for the words "nonfat," "low fat," or "fat free." Avoid buying processed or prepackaged foods. Cooking Cook using low-fat methods, such as baking, broiling, grilling, or boiling. Cook with small amounts of healthy fats, such as olive oil, grapeseed oil, canola oil, or sunflower oil. What foods are recommended? All fresh, frozen, or canned fruits and vegetables. Whole grains. Low-fat or non-fat  (skim) milk and yogurt. Lean meat, skinless poultry, fish, eggs, and beans. Low-fat protein supplement powders or drinks. Spices and herbs. What foods are not recommended? High-fat foods. These include baked goods, fast food, fatty cuts of meat, ice cream, french toast, sweet rolls, pizza, cheese bread, foods covered with butter, creamy sauces, or cheese. Fried foods. These include french fries, tempura, battered fish, breaded chicken, fried breads, and sweets. Foods with strong odors. Foods that cause bloating and gas. Summary A low-fat diet can be helpful if you have a gallbladder condition, or before and after gallbladder surgery. Limit your fat intake to less than 30% of your total daily calories. This is about 60 g of fat if you eat 1,800 calories each day. Eat small, frequent meals throughout the day instead of three large meals. This information is not intended to replace advice given to you by your health care provider. Make sure you discuss any questions you have with your health care provider. Document Revised: 09/26/2019 Document Reviewed: 09/26/2019 Elsevier Patient Education  2022 [place], Adult   A hernia is the bulging of an organ or tissue through a weak spot in the muscles of the abdomen (abdominal wall). Hernias develop most often near the belly button (navel) or the area where the leg meets the lower abdomen (groin). Common types of hernias include: Incisional hernia. This type bulges through a scar from an abdominal surgery. Umbilical hernia. This type develops near the navel. Inguinal hernia. This type develops in the groin or  scrotum. Femoral hernia. This type develops under the groin, in the upper thigh area. Hiatal hernia. This type occurs when part of the stomach slides above the muscle that separates the abdomen from the chest (diaphragm). What are the causes? This condition may be caused by: Heavy lifting. Coughing over a long period of  time. Straining to have a bowel movement. Constipation can lead to straining. An incision made during an abdominal surgery. A physical problem that is present at birth (congenital defect). Being overweight or obese. Smoking. Excess fluid in the abdomen. Undescended testicles in males. What are the signs or symptoms? The main symptom is a skin-colored, rounded bulge in the area of the hernia. However, a bulge may not always be present. It may grow bigger or be more visible when you cough or strain (such as when lifting something heavy). A hernia that can be pushed back into the area (is reducible) rarely causes pain. A hernia that cannot be pushed back into the area (is incarcerated) may lose its blood supply (become strangulated). A hernia that is incarcerated may cause: Pain. Fever. Nausea and vomiting. Swelling. Constipation. How is this diagnosed? A hernia may be diagnosed based on: Your symptoms and medical history. A physical exam. Your health care provider may ask you to cough or move in certain ways to see if the hernia becomes visible. Imaging tests, such as: X-rays. Ultrasound. CT scan. How is this treated? A hernia that is small and painless may not need to be treated. A hernia that is large or painful may be treated with surgery. Inguinal hernias may be treated with surgery to prevent incarceration or strangulation. Strangulated hernias are always treated with surgery because a lack of blood supply to the trapped organ or tissue can cause it to die. Surgery to treat a hernia involves pushing the bulge back into place and repairing the weak area of the muscle or abdominal wall. Follow these instructions at home: Activity Avoid straining. Do not lift anything that is heavier than 10 lb (4.5 kg), or the limit that you are told, until your health care provider says that it is safe. When lifting heavy objects, lift with your leg muscles, not your back muscles. Preventing  constipation Take actions to prevent constipation. Constipation leads to straining with bowel movements, which can make a hernia worse or cause a hernia repair to break down. Your health care provider may recommend that you: Drink enough fluid to keep your urine pale yellow. Eat foods that are high in fiber, such as fresh fruits and vegetables, whole grains, and beans. Limit foods that are high in fat and processed sugars, such as fried or sweet foods. Take an over-the-counter or prescription medicine for constipation. General instructions When coughing, try to cough gently. You may try to push the hernia back in place by very gently pressing on it while lying down. Do not try to force the bulge back in if it will not push in easily. If you are overweight, work with your health care provider to lose weight safely. Do not use any products that contain nicotine or tobacco, such as cigarettes and e-cigarettes. If you need help quitting, ask your health care provider. If you are scheduled for hernia repair, watch your hernia for any changes in shape, size, or color. Tell your health care provider about any changes or new symptoms. Take over-the-counter and prescription medicines only as told by your health care provider. Keep all follow-up visits as told by your health care  provider. This is important. Contact a health care provider if: You develop new pain, swelling, or redness around your hernia. You have signs of constipation, such as: Fewer bowel movements in a week than normal. Difficulty having a bowel movement. Stools that are dry, hard, or larger than normal. Get help right away if: You have a fever. You have abdomen pain that gets worse. You feel nauseous or you vomit. You cannot push the hernia back in place by very gently pressing on it while lying down. Do not try to force the bulge back in if it will not push in easily. The hernia: Changes in shape, size, or color. Feels hard or  tender. These symptoms may represent a serious problem that is an emergency. Do not wait to see if the symptoms will go away. Get medical help right away. Call your local emergency services (911 in the [place] Summary A hernia is the bulging of an organ or tissue through a weak spot in the muscles of the abdomen (abdominal wall). The main symptom is a skin-colored, rounded lump (bulge) in the hernia area. However, a bulge may not always be present. It may grow bigger or more visible when you cough or strain (such as when having a bowel movement). A hernia that is small and painless may not need to be treated. A hernia that is large or painful may be treated with surgery. Surgery to treat a hernia involves pushing the bulge back into place and repairing the weak part of the abdomen. This information is not intended to replace advice given to you by your health care provider. Make sure you discuss any questions you have with your health care provider. Document Revised: 05/31/2018 Document Reviewed: 11/09/2016 Elsevier Patient Education  [place].

## 2020-11-12 NOTE — H&P (View-Only) (Signed)
Patient ID: Nichole Mcneil, female   DOB: Sep 30, 1980, 40 y.o.   MRN: [id number]  Chief Complaint: Gallbladder and hernia  History of Present Illness Nichole Mcneil is a 40 y.o. female with with a history of sporadic gallbladder episodes, becoming more frequently over the last few months.  She reports she has right upper quadrant pain which radiates to the back/right shoulder blade, typically after eating.  She denies nausea and vomiting.  She reports that fried onions or buttered broccoli can exacerbate her pain, but other than that she is watching her diet. She reports loose bowel movements that happen 4-5 times a day, seemingly associated with increased frequency after eating.  She denies any history of jaundice/hepatitis.  No known history of acholic stools.  She denies any melena or hematochezia. A year ago she began evaluations for an incisional hernia, likely from her hysterectomy.  She has had a bulge which is worse when standing, markedly worse with straining that returns to normal when laying supine.  She was evaluated in [place] and [place] both for these and we have a CT scan from [place].  Past Medical History Past Medical History:  Diagnosis Date   Charcot Lelan Pons Tooth muscular atrophy    Migraines    has gotten better since hysterectomy   Muscular dystrophy ([place])    at age 77    Muscular dystrophy [facility])       Past Surgical History:  Procedure Laterality Date   ABDOMINAL HYSTERECTOMY     knee surgery     TUBAL LIGATION      Allergies  Allergen Reactions   Erythromycin    Metronidazole Other (See Comments)    May aggravate neuropathy   Nitrofurantoin     May aggravate neuropathy   Sulfa Antibiotics     Current Outpatient Medications  Medication Sig Dispense Refill   Ascorbic Acid (VITAMIN C) 1000 MG tablet Take 1,000 mg by mouth daily.     BIOTIN PO Take by mouth daily.     Estrogens, Conjugated (PREMARIN VA) Place vaginally daily.     MAGNESIUM PO Take by  mouth 2 (two) times a day.     meloxicam (MOBIC) 15 MG tablet TAKE 1 TABLET BY MOUTH ONCE A DAY 30 tablet 1   progesterone (PROMETRIUM) 100 MG capsule Take 100 mg by mouth daily as needed.     propranolol (INDERAL) 40 MG tablet TAKE 1 TABLET BY MOUTH ONCE A DAY 90 tablet 0   No current facility-administered medications for this visit.    Family History Family History  Problem Relation Age of Onset   Muscular dystrophy Mother    Hyperlipidemia Mother    Endometriosis Mother    Migraines Father    Nephrolithiasis Father    Hypertension Father    Arthritis Brother    Aneurysm Maternal Uncle    Stroke Maternal Grandmother    Aneurysm Maternal Grandfather    Migraines Paternal Grandmother    Cancer Paternal Grandfather        lung      Social History Social History   Tobacco Use   Smoking status: Never   Smokeless tobacco: Never  Substance Use Topics   Alcohol use: Yes    Alcohol/week: 2.0 standard drinks    Types: 2 Glasses of wine per week    Comment: occ   Drug use: No     Review of Systems  Constitutional: Negative.   HENT: Negative.    Eyes: Negative.   Respiratory:  Negative.    Cardiovascular: Negative.   Gastrointestinal:  Positive for abdominal pain, diarrhea and heartburn.  Genitourinary: Negative.   Skin: Negative.   Neurological: Negative.   Psychiatric/Behavioral: Negative.       Physical Exam Blood pressure 121/82, pulse 64, temperature 98 F (36.7 C), temperature source Oral, height 4\' 11"  (1.499 m), weight 127 lb 9.6 oz (57.9 kg), last menstrual period 04/22/2019, SpO2 97 %. Last Weight  Most recent update: 11/12/2020  3:53 PM    Weight  57.9 kg (127 lb 9.6 oz)             CONSTITUTIONAL: Well developed, and nourished, appropriately responsive and aware without distress.   EYES: Sclera non-icteric.   EARS, NOSE, MOUTH AND THROAT: Mask worn.   The oropharynx is clear. Oral mucosa is pink and moist.   Hearing is intact to voice.  NECK:  Trachea is midline, and there is no jugular venous distension.  LYMPH NODES:  Lymph nodes in the neck are not enlarged. RESPIRATORY:  Lungs are clear, and breath sounds are equal bilaterally. Normal respiratory effort without pathologic use of accessory muscles. CARDIOVASCULAR: Heart is regular in rate and rhythm. GI: The abdomen is soft, nontender, and nondistended.  When she raises her head from the table in supine position I feel her infraumbilical recti have a small space between them.  When upright, there is a protrusion of soft tissues creating a bit of a mass in the infraumbilical midline.  This easily resolves with supine positioning.  I do not feel any obvious fascial defect, but there is clearly a change with position that I believe is explainable based on her CT imaging.  There were no other palpable masses. I did not appreciate hepatosplenomegaly. There were normal bowel sounds. MUSCULOSKELETAL:  Symmetrical muscle tone appreciated in all four extremities.    SKIN: Skin turgor is normal. No pathologic skin lesions appreciated.  NEUROLOGIC:  Motor and sensation appear grossly normal.  Cranial nerves are grossly without defect. PSYCH:  Alert and oriented to person, place and time. Affect is appropriate for situation.  Data Reviewed I have personally reviewed what is currently available of the patient's imaging, recent labs and medical records.   Labs:  CBC Latest Ref Rng & Units 07/13/2018 01/17/2018 12/17/2013  WBC 3.8 - 10.8 Thousand/uL 9.5 9.6 15.3(H)  Hemoglobin 11.7 - 15.5 g/dL 14.7 14.8 15.4  Hematocrit 35.0 - 45.0 % 44.0 42.8 47.1(H)  Platelets 140 - 400 Thousand/uL 290 251.0 297   CMP Latest Ref Rng & Units 07/13/2018 01/17/2018 11/04/2013  Glucose 65 - 99 mg/dL 91 85 90  BUN 7 - 25 mg/dL 14 21 21   Creatinine 0.50 - 1.10 mg/dL 0.69 0.72 0.7  Sodium 135 - 146 mmol/L 139 139 137  Potassium 3.5 - 5.3 mmol/L 3.9 4.2 4.4  Chloride 98 - 110 mmol/L 103 101 103  CO2 20 - 32 mmol/L 28  30 25   Calcium 8.6 - 10.2 mg/dL 10.0 9.8 9.5  Total Protein 6.1 - 8.1 g/dL 7.4 7.0 7.4  Total Bilirubin 0.2 - 1.2 mg/dL 0.5 0.6 0.4  Alkaline Phos 39 - 117 U/L - 66 54  AST 10 - 30 U/L 16 18 17   ALT 6 - 29 U/L 17 23 16      Imaging: Radiology review:  CLINICAL DATA:  Initial evaluation for intermittent right upper quadrant pain for several months.   EXAM: ULTRASOUND ABDOMEN LIMITED RIGHT UPPER QUADRANT   COMPARISON:  None.   FINDINGS: Gallbladder:  Probable small amount of echogenic sludge within the gallbladder lumen. No frank cholelithiasis. Gallbladder wall measures within normal limits at 2.4 mm. No free pericholecystic fluid. No sonographic Murphy sign elicited on exam.   Common bile duct:   Diameter: 4.3 mm   Liver:   No focal lesion identified. Within normal limits in parenchymal echogenicity. Portal vein is patent on color Doppler imaging with normal direction of blood flow towards the liver.   Other: None.   IMPRESSION: 1. Gallbladder sludge. No frank cholelithiasis or evidence for acute cholecystitis. 2. No biliary dilatation. 3. Normal sonographic appearance of the liver.     Electronically Signed   By: Jeannine Boga M.D.   On: 10/31/2020 07:10 Within last 24 hrs: No results found. CT abdomen and pelvis without IV contrast   Comparison: None available.   Indication: Hernia, complicated, Y86.5 Incisional hernia without  obstruction or gangrene.   Technique:  CT imaging of the abdomen and pelvis was performed without  intravenous contrast. Coronal and sagittal reformatted images were  generated and reviewed.   Findings:  Limited assessment given the lack of IV contrast, particularly of the solid  organs, bowel, and vasculature.   - Lower Thorax: No suspicious pulmonary abnormalities. No pleural or  pericardial effusions.   - Liver: Unremarkable noncontrast appearance.   - Biliary and Gallbladder: No intrahepatic or extrahepatic  bile duct  dilatation. Probable cholelithiasis   - Spleen: Normal in size.     - Pancreas: Unremarkable noncontrast appearance.   - Adrenal Glands: Normal.   - Kidneys: Indeterminate inferior pole left renal lesions, 9 and 12 mm   - Abdominal and Pelvic Vasculature: No abdominal aortic aneurysm.   - Gastrointestinal Tract: No evidence of bowel obstruction.   - Peritoneum/Mesentery/Retroperitoneum: No free fluid.  No free  intraperitoneal air.   - Lymph Nodes: No retroperitoneal, mesenteric, or pelvic lymphadenopathy.     - Bladder: Normal in appearance.   - Pelvic Organs: Unremarkable.   - Body Wall: Wide necked ventral abdominal hernia containing fat, through  patient's diastases recti 1mm in transverse dimension. Hernia sac starts 7  cm below the umbilicus, and extends 5 cm craniocaudally. Small fatty  umbilical hernia.   - Musculoskeletal:  No aggressive appearing osseous lesions.    Impression:   1.  Fat containing ventral abdominal hernias, as above.  2.  Indeterminate inferior pole left renal lesions which are not fully  characterized on this noncontrast CT. These may represent hemorrhagic  cysts. Recommend dedicated follow-up with CT renal cyst protocol (without  and with contrast).   Electronically Reviewed by:  Aviva Signs, MD, [facility]  Electronically Reviewed on:  10/08/2020 9:06 AM   I have reviewed the images and concur with the above findings.   Electronically Signed by:  Mali Miller, MD, [facility]  Electronically Signed on:  10/08/2020 4:21 PM Assessment    Chronic cholecystitis  Patient Active Problem List   Diagnosis Date Noted   Chronic cholecystitis 11/12/2020   Incisional hernia, without obstruction or gangrene 10/16/2020   HTN (hypertension) 07/23/2019   Constipation 09/23/2016   Oligomenorrhea 09/23/2016   Environmental allergies 02/20/2015   Shingles 06/16/2014   Gout 11/04/2013   Migraines 11/04/2013   Left foot pain  10/08/2013   Pes cavus, congenital 10/08/2013   Premenstrual dysphoric syndrome 08/28/2011   Anal or rectal pain 08/28/2011   Charcot-Marie-Tooth disease    Muscular dystrophy ([place])     Plan    Robotic assisted, vs laparoscopic cholecystectomy.  Repair of incisional hernia, open, or via robotic assisted laparoscopy.   Various surgical options discussed, hoping to avoid the utilization of a mesh hernia repair in the same surgery as gallbladder surgery, and attempting to figure out how to address 2 issues at opposing extremes of the abdomen. May be prudent to avoid permanent mesh, utilize nonprosthetic repair, vs absorbable reinforcement.  Risks and benefits have been discussed with the patient which include but are not limited to anesthesia, bleeding, infection, biliary ductal injury or stenosis, other associated unanticipated injuries affiliated with laparoscopic surgery.  I believe there is the desire to proceed, interpreter utilized as needed.  Questions elicited and answered to satisfaction.  No guarantees ever expressed or implied.  I discussed possibility of incarceration, strangulation, enlargement in size over time, and the need for emergency surgery in the face of these.  Also reviewed the techniques of reduction should incarceration occur, and when unsuccessful to present to the ED.  Also discussed that surgery risks include recurrence which can be up to 30% in the case of complex hernias, use of prosthetic materials (mesh) and the increased risk of infection and the possible need for re-operation and removal of mesh, possibility of post-op SBO or ileus, and the risks of general anesthetic including heart attack, stroke, sudden death or some reaction to anesthetic medications. The patient, and those present, appear to understand the risks, any and all questions were answered to the patient's satisfaction.  No guarantees were ever expressed or implied.  Face-to-face time spent with the  patient and accompanying care providers(if present) was 50 minutes, with more than 50% of the time spent counseling, educating, and coordinating care of the patient.    These notes generated with voice recognition software. I apologize for typographical errors.  [name] M.D., FACS 11/12/2020, 8:16 PM

## 2020-11-12 NOTE — Progress Notes (Signed)
Patient ID: Nichole Mcneil, female   DOB: 1980/06/07, 40 y.o.   MRN: [id number]  Chief Complaint: Gallbladder and hernia  History of Present Illness Nichole Mcneil is a 40 y.o. female with with a history of sporadic gallbladder episodes, becoming more frequently over the last few months.  She reports she has right upper quadrant pain which radiates to the back/right shoulder blade, typically after eating.  She denies nausea and vomiting.  She reports that fried onions or buttered broccoli can exacerbate her pain, but other than that she is watching her diet. She reports loose bowel movements that happen 4-5 times a day, seemingly associated with increased frequency after eating.  She denies any history of jaundice/hepatitis.  No known history of acholic stools.  She denies any melena or hematochezia. A year ago she began evaluations for an incisional hernia, likely from her hysterectomy.  She has had a bulge which is worse when standing, markedly worse with straining that returns to normal when laying supine.  She was evaluated in [place] and [place] both for these and we have a CT scan from [place].  Past Medical History Past Medical History:  Diagnosis Date   Charcot Lelan Pons Tooth muscular atrophy    Migraines    has gotten better since hysterectomy   Muscular dystrophy ([place])    at age 61    Muscular dystrophy [facility])       Past Surgical History:  Procedure Laterality Date   ABDOMINAL HYSTERECTOMY     knee surgery     TUBAL LIGATION      Allergies  Allergen Reactions   Erythromycin    Metronidazole Other (See Comments)    May aggravate neuropathy   Nitrofurantoin     May aggravate neuropathy   Sulfa Antibiotics     Current Outpatient Medications  Medication Sig Dispense Refill   Ascorbic Acid (VITAMIN C) 1000 MG tablet Take 1,000 mg by mouth daily.     BIOTIN PO Take by mouth daily.     Estrogens, Conjugated (PREMARIN VA) Place vaginally daily.     MAGNESIUM PO Take by  mouth 2 (two) times a day.     meloxicam (MOBIC) 15 MG tablet TAKE 1 TABLET BY MOUTH ONCE A DAY 30 tablet 1   progesterone (PROMETRIUM) 100 MG capsule Take 100 mg by mouth daily as needed.     propranolol (INDERAL) 40 MG tablet TAKE 1 TABLET BY MOUTH ONCE A DAY 90 tablet 0   No current facility-administered medications for this visit.    Family History Family History  Problem Relation Age of Onset   Muscular dystrophy Mother    Hyperlipidemia Mother    Endometriosis Mother    Migraines Father    Nephrolithiasis Father    Hypertension Father    Arthritis Brother    Aneurysm Maternal Uncle    Stroke Maternal Grandmother    Aneurysm Maternal Grandfather    Migraines Paternal Grandmother    Cancer Paternal Grandfather        lung      Social History Social History   Tobacco Use   Smoking status: Never   Smokeless tobacco: Never  Substance Use Topics   Alcohol use: Yes    Alcohol/week: 2.0 standard drinks    Types: 2 Glasses of wine per week    Comment: occ   Drug use: No     Review of Systems  Constitutional: Negative.   HENT: Negative.    Eyes: Negative.   Respiratory:  Negative.    Cardiovascular: Negative.   Gastrointestinal:  Positive for abdominal pain, diarrhea and heartburn.  Genitourinary: Negative.   Skin: Negative.   Neurological: Negative.   Psychiatric/Behavioral: Negative.       Physical Exam Blood pressure 121/82, pulse 64, temperature 98 F (36.7 C), temperature source Oral, height 4\' 11"  (1.499 m), weight 127 lb 9.6 oz (57.9 kg), last menstrual period 04/22/2019, SpO2 97 %. Last Weight  Most recent update: 11/12/2020  3:53 PM    Weight  57.9 kg (127 lb 9.6 oz)             CONSTITUTIONAL: Well developed, and nourished, appropriately responsive and aware without distress.   EYES: Sclera non-icteric.   EARS, NOSE, MOUTH AND THROAT: Mask worn.   The oropharynx is clear. Oral mucosa is pink and moist.   Hearing is intact to voice.  NECK:  Trachea is midline, and there is no jugular venous distension.  LYMPH NODES:  Lymph nodes in the neck are not enlarged. RESPIRATORY:  Lungs are clear, and breath sounds are equal bilaterally. Normal respiratory effort without pathologic use of accessory muscles. CARDIOVASCULAR: Heart is regular in rate and rhythm. GI: The abdomen is soft, nontender, and nondistended.  When she raises her head from the table in supine position I feel her infraumbilical recti have a small space between them.  When upright, there is a protrusion of soft tissues creating a bit of a mass in the infraumbilical midline.  This easily resolves with supine positioning.  I do not feel any obvious fascial defect, but there is clearly a change with position that I believe is explainable based on her CT imaging.  There were no other palpable masses. I did not appreciate hepatosplenomegaly. There were normal bowel sounds. MUSCULOSKELETAL:  Symmetrical muscle tone appreciated in all four extremities.    SKIN: Skin turgor is normal. No pathologic skin lesions appreciated.  NEUROLOGIC:  Motor and sensation appear grossly normal.  Cranial nerves are grossly without defect. PSYCH:  Alert and oriented to person, place and time. Affect is appropriate for situation.  Data Reviewed I have personally reviewed what is currently available of the patient's imaging, recent labs and medical records.   Labs:  CBC Latest Ref Rng & Units 07/13/2018 01/17/2018 12/17/2013  WBC 3.8 - 10.8 Thousand/uL 9.5 9.6 15.3(H)  Hemoglobin 11.7 - 15.5 g/dL 14.7 14.8 15.4  Hematocrit 35.0 - 45.0 % 44.0 42.8 47.1(H)  Platelets 140 - 400 Thousand/uL 290 251.0 297   CMP Latest Ref Rng & Units 07/13/2018 01/17/2018 11/04/2013  Glucose 65 - 99 mg/dL 91 85 90  BUN 7 - 25 mg/dL 14 21 21   Creatinine 0.50 - 1.10 mg/dL 0.69 0.72 0.7  Sodium 135 - 146 mmol/L 139 139 137  Potassium 3.5 - 5.3 mmol/L 3.9 4.2 4.4  Chloride 98 - 110 mmol/L 103 101 103  CO2 20 - 32 mmol/L 28  30 25   Calcium 8.6 - 10.2 mg/dL 10.0 9.8 9.5  Total Protein 6.1 - 8.1 g/dL 7.4 7.0 7.4  Total Bilirubin 0.2 - 1.2 mg/dL 0.5 0.6 0.4  Alkaline Phos 39 - 117 U/L - 66 54  AST 10 - 30 U/L 16 18 17   ALT 6 - 29 U/L 17 23 16      Imaging: Radiology review:  CLINICAL DATA:  Initial evaluation for intermittent right upper quadrant pain for several months.   EXAM: ULTRASOUND ABDOMEN LIMITED RIGHT UPPER QUADRANT   COMPARISON:  None.   FINDINGS: Gallbladder:  Probable small amount of echogenic sludge within the gallbladder lumen. No frank cholelithiasis. Gallbladder wall measures within normal limits at 2.4 mm. No free pericholecystic fluid. No sonographic Murphy sign elicited on exam.   Common bile duct:   Diameter: 4.3 mm   Liver:   No focal lesion identified. Within normal limits in parenchymal echogenicity. Portal vein is patent on color Doppler imaging with normal direction of blood flow towards the liver.   Other: None.   IMPRESSION: 1. Gallbladder sludge. No frank cholelithiasis or evidence for acute cholecystitis. 2. No biliary dilatation. 3. Normal sonographic appearance of the liver.     Electronically Signed   By: Nichole Mcneil M.D.   On: 10/31/2020 07:10 Within last 24 hrs: No results found. CT abdomen and pelvis without IV contrast   Comparison: None available.   Indication: Hernia, complicated, T26.7 Incisional hernia without  obstruction or gangrene.   Technique:  CT imaging of the abdomen and pelvis was performed without  intravenous contrast. Coronal and sagittal reformatted images were  generated and reviewed.   Findings:  Limited assessment given the lack of IV contrast, particularly of the solid  organs, bowel, and vasculature.   - Lower Thorax: No suspicious pulmonary abnormalities. No pleural or  pericardial effusions.   - Liver: Unremarkable noncontrast appearance.   - Biliary and Gallbladder: No intrahepatic or extrahepatic  bile duct  dilatation. Probable cholelithiasis   - Spleen: Normal in size.     - Pancreas: Unremarkable noncontrast appearance.   - Adrenal Glands: Normal.   - Kidneys: Indeterminate inferior pole left renal lesions, 9 and 12 mm   - Abdominal and Pelvic Vasculature: No abdominal aortic aneurysm.   - Gastrointestinal Tract: No evidence of bowel obstruction.   - Peritoneum/Mesentery/Retroperitoneum: No free fluid.  No free  intraperitoneal air.   - Lymph Nodes: No retroperitoneal, mesenteric, or pelvic lymphadenopathy.     - Bladder: Normal in appearance.   - Pelvic Organs: Unremarkable.   - Body Wall: Wide necked ventral abdominal hernia containing fat, through  patient's diastases recti 65mm in transverse dimension. Hernia sac starts 7  cm below the umbilicus, and extends 5 cm craniocaudally. Small fatty  umbilical hernia.   - Musculoskeletal:  No aggressive appearing osseous lesions.    Impression:   1.  Fat containing ventral abdominal hernias, as above.  2.  Indeterminate inferior pole left renal lesions which are not fully  characterized on this noncontrast CT. These may represent hemorrhagic  cysts. Recommend dedicated follow-up with CT renal cyst protocol (without  and with contrast).   Electronically Reviewed by:  Nichole Signs, MD, [facility]  Electronically Reviewed on:  10/08/2020 9:06 AM   I have reviewed the images and concur with the above findings.   Electronically Signed by:  Nichole Miller, MD, [facility]  Electronically Signed on:  10/08/2020 4:21 PM Assessment    Chronic cholecystitis  Patient Active Problem List   Diagnosis Date Noted   Chronic cholecystitis 11/12/2020   Incisional hernia, without obstruction or gangrene 10/16/2020   HTN (hypertension) 07/23/2019   Constipation 09/23/2016   Oligomenorrhea 09/23/2016   Environmental allergies 02/20/2015   Shingles 06/16/2014   Gout 11/04/2013   Migraines 11/04/2013   Left foot pain  10/08/2013   Pes cavus, congenital 10/08/2013   Premenstrual dysphoric syndrome 08/28/2011   Anal or rectal pain 08/28/2011   Charcot-Marie-Tooth disease    Muscular dystrophy ([place])     Plan    Robotic assisted, vs laparoscopic cholecystectomy.  Repair of incisional hernia, open, or via robotic assisted laparoscopy.   Various surgical options discussed, hoping to avoid the utilization of a mesh hernia repair in the same surgery as gallbladder surgery, and attempting to figure out how to address 2 issues at opposing extremes of the abdomen. May be prudent to avoid permanent mesh, utilize nonprosthetic repair, vs absorbable reinforcement.  Risks and benefits have been discussed with the patient which include but are not limited to anesthesia, bleeding, infection, biliary ductal injury or stenosis, other associated unanticipated injuries affiliated with laparoscopic surgery.  I believe there is the desire to proceed, interpreter utilized as needed.  Questions elicited and answered to satisfaction.  No guarantees ever expressed or implied.  I discussed possibility of incarceration, strangulation, enlargement in size over time, and the need for emergency surgery in the face of these.  Also reviewed the techniques of reduction should incarceration occur, and when unsuccessful to present to the ED.  Also discussed that surgery risks include recurrence which can be up to 30% in the case of complex hernias, use of prosthetic materials (mesh) and the increased risk of infection and the possible need for re-operation and removal of mesh, possibility of post-op SBO or ileus, and the risks of general anesthetic including heart attack, stroke, sudden death or some reaction to anesthetic medications. The patient, and those present, appear to understand the risks, any and all questions were answered to the patient's satisfaction.  No guarantees were ever expressed or implied.  Face-to-face time spent with the  patient and accompanying care providers(if present) was 50 minutes, with more than 50% of the time spent counseling, educating, and coordinating care of the patient.    These notes generated with voice recognition software. I apologize for typographical errors.  [name] M.D., FACS 11/12/2020, 8:16 PM

## 2020-11-13 ENCOUNTER — Telehealth: Payer: Self-pay | Admitting: Surgery

## 2020-11-13 NOTE — Telephone Encounter (Signed)
Patient has been advised of Pre-Admission date/time, COVID Testing date and Surgery date.  Surgery Date: 11/27/20 Preadmission Testing Date: 11/19/20 (phone 8a-1p) Covid Testing Date: Not needed.    Patient has been made aware to call [phone], between 1-3:00pm the day before surgery, to find out what time to arrive for surgery.

## 2020-11-16 ENCOUNTER — Ambulatory Visit: Payer: Self-pay | Admitting: Surgery

## 2020-11-16 DIAGNOSIS — K811 Chronic cholecystitis: Secondary | ICD-10-CM

## 2020-11-16 DIAGNOSIS — K432 Incisional hernia without obstruction or gangrene: Secondary | ICD-10-CM

## 2020-11-18 ENCOUNTER — Telehealth: Payer: Self-pay | Admitting: Surgery

## 2020-11-18 NOTE — Telephone Encounter (Signed)
Incoming call from the patient.  She is needing a work note for her employer stating that she will be having surgery with Dr. Christian Mate on 11/27/20 and will need for it to also state anticipated date that she will be able to return to work and if there will be any restrictions for a period of time.  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  Please call her when note is ready.  Thank you.

## 2020-11-19 ENCOUNTER — Encounter
Admission: RE | Admit: 2020-11-19 | Discharge: 2020-11-19 | Disposition: A | Discharge status: Home / Self Care | Payer: BC Managed Care – PPO | Source: Ambulatory Visit | Attending: Surgery | Admitting: Surgery

## 2020-11-19 ENCOUNTER — Other Ambulatory Visit: Payer: Self-pay

## 2020-11-19 NOTE — Patient Instructions (Addendum)
Your procedure is scheduled on: 11/27/2020 Report to the Registration Desk on the 1st floor of the [facility]. To find out your arrival time, please call [phone] between 1PM - 3PM on: 11/26/2020  REMEMBER: Instructions that are not followed completely may result in serious medical risk, up to and including death; or upon the discretion of your surgeon and anesthesiologist your surgery may need to be rescheduled.  Do not eat food after midnight the night before surgery.  No gum chewing, lozengers or hard candies.  You may however, drink CLEAR liquids up to 2 hours before you are scheduled to arrive for your surgery. Do not drink anything within 2 hours of your scheduled arrival time.  Clear liquids include: - water  - apple juice without pulp - gatorade (not RED, PURPLE, OR BLUE) - black coffee or tea (Do NOT add milk or creamers to the coffee or tea) Do NOT drink anything that is not on this list.     One week prior to surgery: Stop Anti-inflammatories (NSAIDS) such as Advil, Aleve, Ibuprofen, Motrin, Naproxen, Naprosyn and Aspirin based products such as Excedrin, Goodys Powder, BC Powder ,mobic  Stop ANY OVER THE COUNTER supplements until after surgery. You may however, continue to take Tylenol if needed for pain up until the day of surgery.  No Alcohol for 24 hours before or after surgery.  No Smoking including e-cigarettes for 24 hours prior to surgery.  No chewable tobacco products for at least 6 hours prior to surgery.  No nicotine patches on the day of surgery.  Do not use any "recreational" drugs for at least a week prior to your surgery.  Please be advised that the combination of cocaine and anesthesia may have negative outcomes, up to and including death. If you test positive for cocaine, your surgery will be cancelled.  On the morning of surgery brush your teeth with toothpaste and water, you may rinse your mouth with mouthwash if you wish. Do not swallow any  toothpaste or mouthwash.  Use CHG Soap or wipes as directed on instruction sheet.-provided for you  Do not wear jewelry, make-up, hairpins, clips or nail polish.  Do not wear lotions, powders, or perfumes.   Do not shave body from the neck down 48 hours prior to surgery just in case you cut yourself which could leave a site for infection.  Also, freshly shaved skin may become irritated if using the CHG soap.  Contact lenses, hearing aids and dentures may not be worn into surgery.  Do not bring valuables to the hospital. [facility] is not responsible for any missing/lost belongings or valuables.     Notify your doctor if there is any change in your medical condition (cold, fever, infection).  Wear comfortable clothing (specific to your surgery type) to the hospital.  After surgery, you can help prevent lung complications by doing breathing exercises.  Take deep breaths and cough every 1-2 hours. Your doctor may order a device called an Incentive Spirometer to help you take deep breaths.  When coughing or sneezing, hold a pillow firmly against your incision with both hands. This is called "splinting." Doing this helps protect your incision. It also decreases belly discomfort.  If you are being admitted to the hospital overnight, leave your suitcase in the car. After surgery it may be brought to your room.  If you are being discharged the day of surgery, you will not be allowed to drive home. You will need a responsible adult (18  years or older) to drive you home and stay with you that night.   If you are taking public transportation, you will need to have a responsible adult (18 years or older) with you. Please confirm with your physician that it is acceptable to use public transportation.   Please call the [facility] at [phone] if you have any questions about these instructions.  Surgery Visitation Policy:  Patients undergoing a surgery or procedure may  have one family member or support person with them as long as that person is not COVID-19 positive or experiencing its symptoms.  That person may remain in the waiting area during the procedure and may rotate out with other people.  Inpatient Visitation:    Visiting hours are 7 a.m. to 8 p.m. Up to two visitors ages 16+ are allowed at one time in a patient room. The visitors may rotate out with other people during the day. Visitors must check out when they leave, or other visitors will not be allowed. One designated support person may remain overnight. The visitor must pass COVID-19 screenings, use hand sanitizer when entering and exiting the patient's room and wear a mask at all times, including in the patient's room. Patients must also wear a mask when staff or their visitor are in the room. Masking is required regardless of vaccination status.

## 2020-11-20 ENCOUNTER — Encounter: Payer: Self-pay | Admitting: Urgent Care

## 2020-11-20 ENCOUNTER — Other Ambulatory Visit
Admission: RE | Admit: 2020-11-20 | Discharge: 2020-11-20 | Disposition: A | Discharge status: Home / Self Care | Payer: BC Managed Care – PPO | Source: Ambulatory Visit | Attending: Surgery | Admitting: Surgery

## 2020-11-20 ENCOUNTER — Encounter: Admission: RE | Admit: 2020-11-20 | Payer: BC Managed Care – PPO | Source: Ambulatory Visit

## 2020-11-20 DIAGNOSIS — Z01818 Encounter for other preprocedural examination: Secondary | ICD-10-CM | POA: Insufficient documentation

## 2020-11-20 DIAGNOSIS — Z01810 Encounter for preprocedural cardiovascular examination: Secondary | ICD-10-CM | POA: Diagnosis not present

## 2020-11-20 LAB — CBC WITH DIFFERENTIAL/PLATELET
Abs Immature Granulocytes: 0.02 10*3/uL (ref 0.00–0.07)
Basophils Absolute: 0.1 10*3/uL (ref 0.0–0.1)
Basophils Relative: 1 %
Eosinophils Absolute: 0.3 10*3/uL (ref 0.0–0.5)
Eosinophils Relative: 5 %
HCT: 40 % (ref 36.0–46.0)
Hemoglobin: 13.4 g/dL (ref 12.0–15.0)
Immature Granulocytes: 0 %
Lymphocytes Relative: 37 %
Lymphs Abs: 2.5 10*3/uL (ref 0.7–4.0)
MCH: 33.3 pg (ref 26.0–34.0)
MCHC: 33.5 g/dL (ref 30.0–36.0)
MCV: 99.5 fL (ref 80.0–100.0)
Monocytes Absolute: 0.4 10*3/uL (ref 0.1–1.0)
Monocytes Relative: 6 %
Neutro Abs: 3.6 10*3/uL (ref 1.7–7.7)
Neutrophils Relative %: 51 %
Platelets: 210 10*3/uL (ref 150–400)
RBC: 4.02 MIL/uL (ref 3.87–5.11)
RDW: 11.7 % (ref 11.5–15.5)
WBC: 6.9 10*3/uL (ref 4.0–10.5)
nRBC: 0 % (ref 0.0–0.2)

## 2020-11-20 LAB — COMPREHENSIVE METABOLIC PANEL
ALT: 22 U/L (ref 0–44)
AST: 25 U/L (ref 15–41)
Albumin: 4.3 g/dL (ref 3.5–5.0)
Alkaline Phosphatase: 62 U/L (ref 38–126)
Anion gap: 9 (ref 5–15)
BUN: 29 mg/dL — ABNORMAL HIGH (ref 6–20)
CO2: 27 mmol/L (ref 22–32)
Calcium: 9.7 mg/dL (ref 8.9–10.3)
Chloride: 101 mmol/L (ref 98–111)
Creatinine, Ser: 0.67 mg/dL (ref 0.44–1.00)
GFR, Estimated: >60 mL/min (ref >60)
Glucose, Bld: 85 mg/dL (ref 70–99)
Potassium: 4.7 mmol/L (ref 3.5–5.1)
Sodium: 137 mmol/L (ref 135–145)
Total Bilirubin: 0.8 mg/dL (ref 0.3–1.2)
Total Protein: 7.1 g/dL (ref 6.5–8.1)

## 2020-11-23 ENCOUNTER — Encounter: Payer: Self-pay | Admitting: Family Medicine

## 2020-11-23 NOTE — Telephone Encounter (Signed)
Tell her not to worry about the lesions in the kidney. These will have no effect on her surgery or her recovery afterwards. The other measures of her kidney function are all normal, so she can safely proceed with the planned surgery

## 2020-11-27 ENCOUNTER — Ambulatory Visit: Payer: BC Managed Care – PPO | Admitting: Certified Registered Nurse Anesthetist

## 2020-11-27 ENCOUNTER — Ambulatory Visit
Admission: RE | Admit: 2020-11-27 | Discharge: 2020-11-27 | Disposition: A | Discharge status: Home / Self Care | Payer: BC Managed Care – PPO | Attending: Surgery | Admitting: Surgery

## 2020-11-27 ENCOUNTER — Other Ambulatory Visit: Payer: Self-pay | Admitting: Surgery

## 2020-11-27 ENCOUNTER — Other Ambulatory Visit: Payer: Self-pay

## 2020-11-27 ENCOUNTER — Encounter: Payer: Self-pay | Admitting: Surgery

## 2020-11-27 ENCOUNTER — Encounter
Admission: RE | Disposition: A | Discharge status: Home / Self Care | Payer: Self-pay | Source: Home / Self Care | Attending: Surgery

## 2020-11-27 DIAGNOSIS — K432 Incisional hernia without obstruction or gangrene: Secondary | ICD-10-CM | POA: Insufficient documentation

## 2020-11-27 DIAGNOSIS — Z79899 Other long term (current) drug therapy: Secondary | ICD-10-CM | POA: Diagnosis not present

## 2020-11-27 DIAGNOSIS — K811 Chronic cholecystitis: Secondary | ICD-10-CM | POA: Insufficient documentation

## 2020-11-27 DIAGNOSIS — Z882 Allergy status to sulfonamides status: Secondary | ICD-10-CM | POA: Diagnosis not present

## 2020-11-27 DIAGNOSIS — K439 Ventral hernia without obstruction or gangrene: Secondary | ICD-10-CM | POA: Diagnosis not present

## 2020-11-27 DIAGNOSIS — Z881 Allergy status to other antibiotic agents status: Secondary | ICD-10-CM | POA: Diagnosis not present

## 2020-11-27 DIAGNOSIS — Z791 Long term (current) use of non-steroidal anti-inflammatories (NSAID): Secondary | ICD-10-CM | POA: Diagnosis not present

## 2020-11-27 DIAGNOSIS — Z888 Allergy status to other drugs, medicaments and biological substances status: Secondary | ICD-10-CM | POA: Insufficient documentation

## 2020-11-27 DIAGNOSIS — K819 Cholecystitis, unspecified: Secondary | ICD-10-CM | POA: Diagnosis not present

## 2020-11-27 HISTORY — PX: INCISIONAL HERNIA REPAIR: SHX193

## 2020-11-27 SURGERY — CHOLECYSTECTOMY, ROBOT-ASSISTED, LAPAROSCOPIC
Anesthesia: General

## 2020-11-27 MED ORDER — IBUPROFEN 800 MG PO TABS: 800.0000 mg | ORAL_TABLET | Freq: Three times a day (TID) | ORAL | 0 refills | Status: DC | PRN

## 2020-11-27 MED ORDER — DEXAMETHASONE SODIUM PHOSPHATE 10 MG/ML IJ SOLN
INTRAMUSCULAR | Status: AC
  Filled 2020-11-27: qty 2

## 2020-11-27 MED ORDER — CHLORHEXIDINE GLUCONATE 0.12 % MT SOLN: 15.0000 mL | Freq: Once | OROMUCOSAL | Status: AC

## 2020-11-27 MED ORDER — EPHEDRINE SULFATE 50 MG/ML IJ SOLN
INTRAMUSCULAR | Status: DC | PRN
  Administered 2020-11-27 (×2): 2.5 mg via INTRAVENOUS

## 2020-11-27 MED ORDER — FAMOTIDINE 20 MG PO TABS: 20.0000 mg | ORAL_TABLET | Freq: Once | ORAL | Status: AC

## 2020-11-27 MED ORDER — DEXMEDETOMIDINE (PRECEDEX) IN NS 20 MCG/5ML (4 MCG/ML) IV SYRINGE
PREFILLED_SYRINGE | INTRAVENOUS | Status: DC | PRN
  Administered 2020-11-27: 4 ug via INTRAVENOUS

## 2020-11-27 MED ORDER — BUPIVACAINE-EPINEPHRINE 0.25% -1:200000 IJ SOLN
INTRAMUSCULAR | Status: DC | PRN
  Administered 2020-11-27: 10 mL
  Administered 2020-11-27: 40 mL

## 2020-11-27 MED ORDER — BUPIVACAINE-EPINEPHRINE (PF) 0.25% -1:200000 IJ SOLN
INTRAMUSCULAR | Status: AC
  Filled 2020-11-27: qty 30

## 2020-11-27 MED ORDER — PROPOFOL 10 MG/ML IV BOLUS
INTRAVENOUS | Status: DC | PRN
  Administered 2020-11-27: 120 mg via INTRAVENOUS

## 2020-11-27 MED ORDER — CHLORHEXIDINE GLUCONATE CLOTH 2 % EX PADS
6.0000 | MEDICATED_PAD | Freq: Once | CUTANEOUS | Status: AC
  Administered 2020-11-27: 6 via TOPICAL

## 2020-11-27 MED ORDER — LIDOCAINE HCL (PF) 2 % IJ SOLN
INTRAMUSCULAR | Status: AC
  Filled 2020-11-27: qty 10

## 2020-11-27 MED ORDER — SUGAMMADEX SODIUM 200 MG/2ML IV SOLN
INTRAVENOUS | Status: DC | PRN
  Administered 2020-11-27: 200 mg via INTRAVENOUS

## 2020-11-27 MED ORDER — MIDAZOLAM HCL 2 MG/2ML IJ SOLN
INTRAMUSCULAR | Status: AC
  Filled 2020-11-27: qty 2

## 2020-11-27 MED ORDER — PROMETHAZINE HCL 25 MG/ML IJ SOLN: 6.2500 mg | INTRAMUSCULAR | Status: DC | PRN

## 2020-11-27 MED ORDER — LACTATED RINGERS IV SOLN
INTRAVENOUS | Status: DC
Start: 2020-11-27 — End: 2020-11-27

## 2020-11-27 MED ORDER — CEFAZOLIN SODIUM-DEXTROSE 2-4 GM/100ML-% IV SOLN
INTRAVENOUS | Status: AC
  Filled 2020-11-27: qty 100

## 2020-11-27 MED ORDER — GABAPENTIN 300 MG PO CAPS: 300.0000 mg | ORAL_CAPSULE | ORAL | Status: AC

## 2020-11-27 MED ORDER — ROCURONIUM BROMIDE 100 MG/10ML IV SOLN
INTRAVENOUS | Status: DC | PRN
  Administered 2020-11-27: 40 mg via INTRAVENOUS
  Administered 2020-11-27 (×5): 20 mg via INTRAVENOUS

## 2020-11-27 MED ORDER — GLYCOPYRROLATE 0.2 MG/ML IJ SOLN
INTRAMUSCULAR | Status: DC | PRN
  Administered 2020-11-27: .2 mg via INTRAVENOUS

## 2020-11-27 MED ORDER — ROCURONIUM BROMIDE 10 MG/ML (PF) SYRINGE
PREFILLED_SYRINGE | INTRAVENOUS | Status: AC
  Filled 2020-11-27: qty 20

## 2020-11-27 MED ORDER — BUPIVACAINE LIPOSOME 1.3 % IJ SUSP: 20.0000 mL | Freq: Once | INTRAMUSCULAR | Status: DC

## 2020-11-27 MED ORDER — ORAL CARE MOUTH RINSE: 15.0000 mL | Freq: Once | OROMUCOSAL | Status: AC

## 2020-11-27 MED ORDER — ACETAMINOPHEN 500 MG PO TABS
ORAL_TABLET | ORAL | Status: AC
  Administered 2020-11-27: 1000 mg via ORAL
  Filled 2020-11-27: qty 2

## 2020-11-27 MED ORDER — ONDANSETRON HCL 4 MG/2ML IJ SOLN
INTRAMUSCULAR | Status: DC | PRN
Start: 2020-11-27 — End: 2020-11-27
  Administered 2020-11-27: 4 mg via INTRAVENOUS

## 2020-11-27 MED ORDER — HYDROCODONE-ACETAMINOPHEN 5-325 MG PO TABS: 1.0000 | ORAL_TABLET | Freq: Four times a day (QID) | ORAL | 0 refills | Status: DC | PRN

## 2020-11-27 MED ORDER — APREPITANT 40 MG PO CAPS
40.0000 mg | ORAL_CAPSULE | Freq: Once | ORAL | Status: AC
  Administered 2020-11-27: 40 mg via ORAL

## 2020-11-27 MED ORDER — GABAPENTIN 300 MG PO CAPS
ORAL_CAPSULE | ORAL | Status: AC
  Administered 2020-11-27: 300 mg via ORAL
  Filled 2020-11-27: qty 1

## 2020-11-27 MED ORDER — FENTANYL CITRATE (PF) 100 MCG/2ML IJ SOLN
INTRAMUSCULAR | Status: AC
  Filled 2020-11-27: qty 2

## 2020-11-27 MED ORDER — FENTANYL CITRATE (PF) 100 MCG/2ML IJ SOLN
INTRAMUSCULAR | Status: AC
  Administered 2020-11-27: 25 ug via INTRAVENOUS
  Filled 2020-11-27: qty 2

## 2020-11-27 MED ORDER — TRAMADOL HCL 50 MG PO TABS: 50.0000 mg | ORAL_TABLET | Freq: Four times a day (QID) | ORAL | 0 refills | Status: AC | PRN

## 2020-11-27 MED ORDER — DEXMEDETOMIDINE (PRECEDEX) IN NS 20 MCG/5ML (4 MCG/ML) IV SYRINGE
PREFILLED_SYRINGE | INTRAVENOUS | Status: AC
  Filled 2020-11-27: qty 10

## 2020-11-27 MED ORDER — ACETAMINOPHEN 500 MG PO TABS: 1000.0000 mg | ORAL_TABLET | ORAL | Status: AC

## 2020-11-27 MED ORDER — DEXAMETHASONE SODIUM PHOSPHATE 10 MG/ML IJ SOLN
INTRAMUSCULAR | Status: DC | PRN
Start: 2020-11-27 — End: 2020-11-27
  Administered 2020-11-27: 10 mg via INTRAVENOUS

## 2020-11-27 MED ORDER — BUPIVACAINE LIPOSOME 1.3 % IJ SUSP
INTRAMUSCULAR | Status: AC
  Filled 2020-11-27: qty 20

## 2020-11-27 MED ORDER — LIDOCAINE HCL (CARDIAC) PF 100 MG/5ML IV SOSY
PREFILLED_SYRINGE | INTRAVENOUS | Status: DC | PRN
  Administered 2020-11-27: 60 mg via INTRAVENOUS

## 2020-11-27 MED ORDER — CEFAZOLIN SODIUM-DEXTROSE 2-4 GM/100ML-% IV SOLN
2.0000 g | INTRAVENOUS | Status: AC
  Administered 2020-11-27: 2 g via INTRAVENOUS

## 2020-11-27 MED ORDER — PROPOFOL 10 MG/ML IV BOLUS
INTRAVENOUS | Status: AC
  Filled 2020-11-27: qty 20

## 2020-11-27 MED ORDER — CHLORHEXIDINE GLUCONATE CLOTH 2 % EX PADS: 6.0000 | MEDICATED_PAD | Freq: Once | CUTANEOUS | Status: DC

## 2020-11-27 MED ORDER — MIDAZOLAM HCL 2 MG/2ML IJ SOLN
INTRAMUSCULAR | Status: DC | PRN
  Administered 2020-11-27: 2 mg via INTRAVENOUS

## 2020-11-27 MED ORDER — CHLORHEXIDINE GLUCONATE 0.12 % MT SOLN
OROMUCOSAL | Status: AC
  Administered 2020-11-27: 15 mL via OROMUCOSAL
  Filled 2020-11-27: qty 15

## 2020-11-27 MED ORDER — APREPITANT 40 MG PO CAPS
ORAL_CAPSULE | ORAL | Status: AC
  Filled 2020-11-27: qty 1

## 2020-11-27 MED ORDER — FENTANYL CITRATE (PF) 100 MCG/2ML IJ SOLN
INTRAMUSCULAR | Status: DC | PRN
  Administered 2020-11-27 (×2): 50 ug via INTRAVENOUS

## 2020-11-27 MED ORDER — PHENYLEPHRINE HCL (PRESSORS) 10 MG/ML IV SOLN
INTRAVENOUS | Status: DC | PRN
  Administered 2020-11-27: 100 ug via INTRAVENOUS

## 2020-11-27 MED ORDER — ONDANSETRON HCL 4 MG/2ML IJ SOLN
INTRAMUSCULAR | Status: AC
  Filled 2020-11-27: qty 4

## 2020-11-27 MED ORDER — INDOCYANINE GREEN 25 MG IV SOLR
2.5000 mg | Freq: Once | INTRAVENOUS | Status: AC
  Administered 2020-11-27: 2.5 mg via INTRAVENOUS
  Filled 2020-11-27: qty 10

## 2020-11-27 MED ORDER — KETOROLAC TROMETHAMINE 30 MG/ML IJ SOLN
INTRAMUSCULAR | Status: DC | PRN
  Administered 2020-11-27: 30 mg via INTRAVENOUS

## 2020-11-27 MED ORDER — PHENYLEPHRINE HCL (PRESSORS) 10 MG/ML IV SOLN
INTRAVENOUS | Status: AC
  Filled 2020-11-27: qty 1

## 2020-11-27 MED ORDER — GLYCOPYRROLATE 0.2 MG/ML IJ SOLN
INTRAMUSCULAR | Status: AC
  Filled 2020-11-27: qty 2

## 2020-11-27 MED ORDER — FENTANYL CITRATE (PF) 100 MCG/2ML IJ SOLN
25.0000 ug | INTRAMUSCULAR | Status: DC | PRN
  Administered 2020-11-27 (×3): 25 ug via INTRAVENOUS

## 2020-11-27 MED ORDER — FAMOTIDINE 20 MG PO TABS
ORAL_TABLET | ORAL | Status: AC
  Administered 2020-11-27: 20 mg via ORAL
  Filled 2020-11-27: qty 1

## 2020-11-27 SURGICAL SUPPLY — 60 items
ADH SKN CLS APL DERMABOND .7 (GAUZE/BANDAGES/DRESSINGS) ×1
APL PRP STRL LF DISP 70% ISPRP (MISCELLANEOUS) ×1
BAG SPEC RTRVL LRG 6X4 10 (ENDOMECHANICALS) ×1
BINDER ABDOMINAL  9 SM 30-45 (SOFTGOODS) ×2
BINDER ABDOMINAL 9 SM 30-45 (SOFTGOODS) IMPLANT
CANNULA CAP OBTURATR AIRSEAL 8 (CAP) ×2 IMPLANT
CHLORAPREP W/TINT 26 (MISCELLANEOUS) ×2 IMPLANT
CLIP VESOLOCK LG 6/CT PURPLE (CLIP) ×2 IMPLANT
COVER TIP SHEARS 8 DVNC (MISCELLANEOUS) ×1 IMPLANT
COVER TIP SHEARS 8MM DA VINCI (MISCELLANEOUS) ×2
DEFOGGER SCOPE WARMER CLEARIFY (MISCELLANEOUS) ×2 IMPLANT
DERMABOND ADVANCED (GAUZE/BANDAGES/DRESSINGS) ×1
DERMABOND ADVANCED .7 DNX12 (GAUZE/BANDAGES/DRESSINGS) ×1 IMPLANT
DRAPE ARM DVNC X/XI (DISPOSABLE) ×4 IMPLANT
DRAPE COLUMN DVNC XI (DISPOSABLE) ×1 IMPLANT
DRAPE DA VINCI XI ARM (DISPOSABLE) ×8
DRAPE DA VINCI XI COLUMN (DISPOSABLE) ×2
DRAPE LAPAROTOMY 100X77 ABD (DRAPES) ×2 IMPLANT
ELECT CAUTERY BLADE 6.4 (BLADE) ×2 IMPLANT
ELECT REM PT RETURN 9FT ADLT (ELECTROSURGICAL) ×2
ELECTRODE REM PT RTRN 9FT ADLT (ELECTROSURGICAL) ×1 IMPLANT
GAUZE 4X4 16PLY ~~LOC~~+RFID DBL (SPONGE) ×2 IMPLANT
GLOVE SURG ORTHO LTX SZ7.5 (GLOVE) ×4 IMPLANT
GOWN STRL REUS W/ TWL LRG LVL3 (GOWN DISPOSABLE) ×4 IMPLANT
GOWN STRL REUS W/TWL LRG LVL3 (GOWN DISPOSABLE) ×8
GRASPER SUT TROCAR 14GX15 (MISCELLANEOUS) IMPLANT
KIT PINK PAD W/HEAD ARE REST (MISCELLANEOUS) ×2
KIT PINK PAD W/HEAD ARM REST (MISCELLANEOUS) ×1 IMPLANT
KIT TURNOVER KIT A (KITS) ×2 IMPLANT
LABEL OR SOLS (LABEL) ×2 IMPLANT
MESH VENTRALIGHT ST 4.5IN (Mesh General) ×2 IMPLANT
NDL INSUFFLATION 14GA 120MM (NEEDLE) IMPLANT
NEEDLE HYPO 22GX1.5 SAFETY (NEEDLE) ×2 IMPLANT
NEEDLE INSUFFLATION 14GA 120MM (NEEDLE) ×2 IMPLANT
NS IRRIG 500ML POUR BTL (IV SOLUTION) ×2 IMPLANT
PACK BASIN MINOR ARMC (MISCELLANEOUS) ×2 IMPLANT
PACK LAP CHOLECYSTECTOMY (MISCELLANEOUS) ×2 IMPLANT
PENCIL ELECTRO HAND CTR (MISCELLANEOUS) ×2 IMPLANT
POUCH SPECIMEN RETRIEVAL 10MM (ENDOMECHANICALS) ×2 IMPLANT
SEAL CANN UNIV 5-8 DVNC XI (MISCELLANEOUS) ×3 IMPLANT
SEAL XI 5MM-8MM UNIVERSAL (MISCELLANEOUS) ×6
SET TUBE FILTERED XL AIRSEAL (SET/KITS/TRAYS/PACK) ×2 IMPLANT
SOLUTION ELECTROLUBE (MISCELLANEOUS) ×2 IMPLANT
SPONGE T-LAP 18X18 ~~LOC~~+RFID (SPONGE) ×2 IMPLANT
STAPLER SKIN PROX 35W (STAPLE) IMPLANT
SUT ETHIBOND 0 MO6 C/R (SUTURE) ×2 IMPLANT
SUT MNCRL 4-0 (SUTURE) ×2
SUT MNCRL 4-0 27XMFL (SUTURE) ×1
SUT STRATAFIX 0 PDS+ CT-2 23 (SUTURE) ×4
SUT VIC AB 3-0 SH 27 (SUTURE) ×2
SUT VIC AB 3-0 SH 27X BRD (SUTURE) ×1 IMPLANT
SUT VICRYL 0 AB UR-6 (SUTURE) ×2 IMPLANT
SUT VLOC 90 2/L VL 12 GS22 (SUTURE) ×2 IMPLANT
SUTURE MNCRL 4-0 27XMF (SUTURE) ×1 IMPLANT
SUTURE STRATFX 0 PDS+ CT-2 23 (SUTURE) IMPLANT
SYR 20ML LL LF (SYRINGE) ×2 IMPLANT
SYR BULB IRRIG 60ML STRL (SYRINGE) ×2 IMPLANT
TRAY FOLEY MTR SLVR 16FR STAT (SET/KITS/TRAYS/PACK) ×1 IMPLANT
TROCAR Z-THREAD FIOS 11X100 BL (TROCAR) ×1 IMPLANT
TROCAR Z-THREAD OPTICAL 5X100M (TROCAR) ×1 IMPLANT

## 2020-11-27 NOTE — Op Note (Signed)
Robotic assisted laparoscopic Cholecystecteomy with Indocyamine Green Ductal Imaging and Incisional hernia Repair using  oval Ventralight BARD mesh   Pre-operative Diagnosis: Cholecystitis, Incisional hernia   Post-operative Diagnosis: same   Surgeon:  [name], M.D., FACS   Anesthesia: Gen. with endotracheal tube   Findings:     Estimated Blood Loss: 10 mL   Complications: none           Procedure Details  The patient was seen again in the Holding Room. The benefits, complications, treatment options, and expected outcomes were discussed with the patient. The risks of bleeding, infection, recurrence of symptoms, failure to resolve symptoms, bowel injury, mesh placement, mesh infection, any of which could require further surgery were reviewed with the patient. The likelihood of improving the patient's symptoms with return to their baseline status is good.  The patient and/or family concurred with the proposed plan, giving informed consent.  The patient was taken to Operating Room, identified and the procedure verified.  A Time Out was held and the above information confirmed.   Prior to the induction of general anesthesia, antibiotic prophylaxis was administered. VTE prophylaxis was in place. General endotracheal anesthesia was then administered and tolerated well. After the induction, the abdomen was prepped with Chloraprep and draped in the sterile fashion. The patient was positioned in the supine position. After local infiltration of quarter percent Marcaine with epinephrine, stab incision was made left upper quadrant.  Just below the costal margin approximately midclavicular line the Veress needle is passed with sensation of the layers to penetrate the abdominal wall and into the peritoneum.  Saline drop test is confirmed peritoneal placement.  Insufflation is initiated with carbon dioxide to pressures of 15 mmHg. Marcaine quarter percent with Exparel was used to inject all the  incision sites. We used a left upper quadrant subcostal incision and using an optical FIOS 11 mm trocar, it was inserted with direct visualization and pneumoperitoneum obtained.  No hemodynamic compromise, no evidence of any intra-peritoneal injury.   2 additional 8 mm ports were placed under direct visualization on the left lateral abdomen.  I visualized the hernia and there was a low anterior abdominal wall hernia measuring approximately 2 cm by 5 cm.  I placed a 5 mm assistant port under direct visualization in the the right upper quadrant.  The robot was brought to the surgical field and docked in the standard fashion.  We made sure that all instrumentation was kept under direct vision at all times and there was no collision between the arms.  I scrubbed out and went to the console.  The gallbladder was identified, the fundus grasped via the my assistant via the 5 mm port and retracted cephalad. Adhesions were lysed with scissors and cautery.  The infundibulum was identified grasped and retracted laterally, exposing the peritoneum overlying the triangle of Calot. This was then opened and dissected using cautery & scissors. An extended critical view of the cystic duct and cystic artery was obtained, aided by the ICG via FireFly which enabled ready visualization of the ductal anatomy.    The cystic duct was clearly identified and dissected to isolation.   Artery well isolated, and the cystic duct was triple clipped and divided with scissors, as close to the gallbladder neck as feasible, thus leaving two on the remaining stump.  The cystic artery is sealed with bipolar and divided with monopolar scissors.  The gallbladder was taken from the gallbladder fossa in a retrograde fashion with the electrocautery.  It  was left in the right upper quadrant for later extraction. Attention was then turned to the hernia: Adjacent preperitoneal adipose and umbilical ligaments and hernia sac were taken down with  electrocautery to allow adequate mesh placement to be secured to the fascial tissue. Confirm and measured that the defect was 2 cm wide and 5 cm craniocaudal.  .  Using a 0 V-lock suture we closed the ventral defect primarily.    I elected to take an 11 cm circle of mesh, and trim it to an 8 cm x 11 cm oval. I used the fascial closure suture to secure the mesh centering it over the fascial closure.    The mesh was then secured circumferentially to the abdominal wall using 2-0 V-lock in the standard fashion.  The mesh appeared well secured against the  abdominal wall. A second look laparoscopy revealed no evidence of intra-abdominal injury.   I then proceeded with closure of the peritoneum over the mesh.  I utilized a coated mesh due to the imperfections in the peritoneal covering. All the needles were removed under direct visualization.  The instruments were removed and the robot was undocked.   The gallbladder was placed in an Endocatch bag, and removed without issue.  The liver bed is inspected. Hemostasis was confirmed.  The laparoscopic ports were removed under direct visualization and the pneumoperitoneum was deflated.   The left upper quadrant subcostal fascial sutures approximated in the standard fashion, with 0 Vicryl, under direct visualization. Incisions were closed with  4-0 Monocryl   Dermabond was used to coat the skin.  Patient tolerated procedure well and there were no immediate complications. Needle and laparotomy counts were correct   Drains: None         Specimens: Gallbladder           Complications: none  [name] M.D., [facility] 11/27/2020 11:12 AM

## 2020-11-27 NOTE — Progress Notes (Signed)
Patient requested Rx for Ultram rather than Hydrocodone requested from [facility] to be sent to Pharmacy

## 2020-11-27 NOTE — Anesthesia Procedure Notes (Signed)
Procedure Name: Intubation Date/Time: 11/27/2020 7:39 AM Performed by: Lily Peer, Nakaiya Beddow, CRNA Pre-anesthesia Checklist: Patient identified, Emergency Drugs available, Suction available and Patient being monitored Patient Re-evaluated:Patient Re-evaluated prior to induction Oxygen Delivery Method: Circle system utilized Preoxygenation: Pre-oxygenation with 100% oxygen Induction Type: IV induction Ventilation: Mask ventilation without difficulty Laryngoscope Size: McGraph and 3 Grade View: Grade I Tube type: Oral Tube size: 6.5 mm Number of attempts: 1 Airway Equipment and Method: Stylet Placement Confirmation: ETT inserted through vocal cords under direct vision, positive ETCO2 and breath sounds checked- equal and bilateral Secured at: 19 cm Tube secured with: Tape Dental Injury: Teeth and Oropharynx as per pre-operative assessment

## 2020-11-27 NOTE — Transfer of Care (Signed)
Immediate Anesthesia Transfer of Care Note  Patient: Nichole Mcneil  Procedure(s) Performed: XI ROBOTIC ASSISTED LAPAROSCOPIC CHOLECYSTECTOMY HERNIA REPAIR INCISIONAL INDOCYANINE GREEN FLUORESCENCE IMAGING (ICG)  Patient Location: PACU  Anesthesia Type:General  Level of Consciousness: awake, alert  and oriented  Airway & Oxygen Therapy: Patient Spontanous Breathing and Patient connected to face mask oxygen  Post-op Assessment: Report given to RN and Post -op Vital signs reviewed and stable  Post vital signs: Reviewed and stable  Last Vitals:  Vitals Value Taken Time  BP 109/53 11/27/20 1112  Temp    Pulse 72 11/27/20 1113  Resp 13 11/27/20 1113  SpO2 100 % 11/27/20 1113  Vitals shown include unvalidated device data.  Last Pain:  Vitals:   11/27/20 0626  TempSrc: Temporal  PainSc: 0-No pain         Complications: No notable events documented.

## 2020-11-27 NOTE — Anesthesia Preprocedure Evaluation (Signed)
Anesthesia Evaluation  Patient identified by MRN, date of birth, ID band Patient awake    Reviewed: Allergy & Precautions, H&P , NPO status , Patient's Chart, lab work & pertinent test results, reviewed documented beta blocker date and time   History of Anesthesia Complications Negative for: history of anesthetic complications  Airway Mallampati: II  TM Distance: >3 FB Neck ROM: full    Dental  (+) Dental Advidsory Given, Teeth Intact   Pulmonary neg pulmonary ROS,    Pulmonary exam normal breath sounds clear to auscultation       Cardiovascular Exercise Tolerance: Good hypertension, (-) angina(-) Past MI and (-) Cardiac Stents Normal cardiovascular exam(-) dysrhythmias (-) Valvular Problems/Murmurs Rhythm:regular Rate:Normal     Neuro/Psych neg Seizures PSYCHIATRIC DISORDERS Depression  Neuromuscular disease (Charcot Marie Tooth)    GI/Hepatic Neg liver ROS, GERD  ,  Endo/Other  negative endocrine ROS  Renal/GU negative Renal ROS  negative genitourinary   Musculoskeletal   Abdominal   Peds  Hematology negative hematology ROS (+)   Anesthesia Other Findings Past Medical History: 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 No date: Migraines     Comment:  has gotten better since hysterectomy No date: Muscular dystrophy ([place])     Comment:  at age 41  No date: Muscular dystrophy ([place])   Reproductive/Obstetrics negative OB ROS                             Anesthesia Physical Anesthesia Plan  ASA: 2  Anesthesia Plan: General   Post-op Pain Management:    Induction: Intravenous  PONV Risk Score and Plan: 3 and Ondansetron, Dexamethasone, Aprepitant, Midazolam, Promethazine and Treatment may vary due to age or medical condition  Airway Management Planned: Oral ETT  Additional Equipment:   Intra-op Plan:   Post-operative Plan: Extubation in OR  Informed Consent: I have reviewed  the patients History and Physical, chart, labs and discussed the procedure including the risks, benefits and alternatives for the proposed anesthesia with the patient or authorized representative who has indicated his/her understanding and acceptance.     Dental Advisory Given  Plan Discussed with: Anesthesiologist, CRNA and Surgeon  Anesthesia Plan Comments:         Anesthesia Quick Evaluation

## 2020-11-27 NOTE — Interval H&P Note (Signed)
History and Physical Interval Note:  11/27/2020 7:25 AM  Nichole Mcneil  has presented today for surgery, with the diagnosis of chronic calculous cholecystitis, ventral hernia.  The various methods of treatment have been discussed with the patient and family. After consideration of risks, benefits and other options for treatment, the patient has consented to  Procedure(s): XI ROBOTIC ASSISTED LAPAROSCOPIC CHOLECYSTECTOMY (N/A) HERNIA REPAIR INCISIONAL (N/A) [name] (ICG) (N/A) as a surgical intervention.  The patient's history has been reviewed, patient examined, no change in status, stable for surgery.  I have reviewed the patient's chart and labs.  Questions were answered to the patient's satisfaction.     [name]

## 2020-11-27 NOTE — Anesthesia Postprocedure Evaluation (Signed)
Anesthesia Post Note  Patient: Nichole Mcneil  Procedure(s) Performed: XI ROBOTIC ASSISTED LAPAROSCOPIC CHOLECYSTECTOMY HERNIA REPAIR INCISIONAL INDOCYANINE GREEN FLUORESCENCE IMAGING (ICG)  Patient location during evaluation: PACU Anesthesia Type: General Level of consciousness: awake and alert Pain management: pain level controlled Vital Signs Assessment: post-procedure vital signs reviewed and stable Respiratory status: spontaneous breathing, nonlabored ventilation, respiratory function stable and patient connected to nasal cannula oxygen Cardiovascular status: blood pressure returned to baseline and stable Postop Assessment: no apparent nausea or vomiting Anesthetic complications: no   No notable events documented.   Last Vitals:  Vitals:   11/27/20 1200 11/27/20 1212  BP: 113/69 (!) 114/55  Pulse: 69 64  Resp: 16 16  Temp: 36.7 C (!) 36.3 C  SpO2: 97% 98%    Last Pain:  Vitals:   11/27/20 1212  TempSrc: Tympanic  PainSc: 0-No pain                 Precious Haws Terence Bart

## 2020-11-27 NOTE — Discharge Instructions (Addendum)
AMBULATORY SURGERY  DISCHARGE INSTRUCTIONS   The drugs that you were given will stay in your system until tomorrow so for the next 24 hours you should not:  Drive an automobile Make any legal decisions Drink any alcoholic beverage   You may resume regular meals tomorrow.  Today it is better to start with liquids and gradually work up to solid foods.  You may eat anything you prefer, but it is better to start with liquids, then soup and crackers, and gradually work up to solid foods.   Please notify your doctor immediately if you have any unusual bleeding, trouble breathing, redness and pain at the surgery site, drainage, fever, or pain not relieved by medication.     Your post-operative visit                            is: Date:   12/10/2020                  Time:  10:30am    Additional Instructions:   Information for Discharge Teaching: EXPAREL (bupivacaine liposome injectable suspension) TEAL ARM BAND DO NOT REMOVE FOR 96 hours, 4 days. Your surgeon or anesthesiologist gave you EXPAREL(bupivacaine) to help control your pain after surgery.  EXPAREL is a local anesthetic that provides pain relief by numbing the tissue around the surgical site. EXPAREL is designed to release pain medication over time and can control pain for up to 72 hours. Depending on how you respond to EXPAREL, you may require less pain medication during your recovery.  Possible side effects: Temporary loss of sensation or ability to move in the area where bupivacaine was injected. Nausea, vomiting, constipation Rarely, numbness and tingling in your mouth or lips, lightheadedness, or anxiety may occur. Call your doctor right away if you think you may be experiencing any of these sensations, or if you have other questions regarding possible side effects.  Follow all other discharge instructions given to you by your surgeon or nurse. Eat a healthy diet and drink plenty of water or other fluids.  If you  return to the hospital for any reason within 96 hours following the administration of EXPAREL, it is important for health care providers to know that you have received this anesthetic. A teal colored band has been placed on your arm with the date, time and amount of EXPAREL you have received in order to alert and inform your health care providers. Please leave this armband in place for the full 96 hours following administration, and then you may remove the band.

## 2020-11-30 ENCOUNTER — Telehealth: Payer: Self-pay

## 2020-11-30 ENCOUNTER — Encounter: Payer: Self-pay | Admitting: Surgery

## 2020-11-30 LAB — SURGICAL PATHOLOGY

## 2020-11-30 NOTE — Telephone Encounter (Signed)
Surgery 10/7 22 Dr.Rodenberg  :Laparoscopic Cholecystectomy and hernia repair- today patient notices sharp pain in the top incision -no redness or drainage- does not feel hot to the touch=currently taking tramadol and ibuprofen 800 mg every 8 hours. Denies fever and chills. Denies nausea or vomiting. Patient instructed to continue taking narcotics, apply ice to the area-and if pain worsens call office. She is having good bowel movements .

## 2020-12-03 ENCOUNTER — Other Ambulatory Visit: Payer: Self-pay

## 2020-12-03 ENCOUNTER — Ambulatory Visit (INDEPENDENT_AMBULATORY_CARE_PROVIDER_SITE_OTHER): Payer: BC Managed Care – PPO | Admitting: Physician Assistant

## 2020-12-03 ENCOUNTER — Encounter: Payer: Self-pay | Admitting: Physician Assistant

## 2020-12-03 VITALS — BP 104/66 | HR 82 | Temp 98.3°F | Ht 59.0 in | Wt 124.8 lb

## 2020-12-03 DIAGNOSIS — Z09 Encounter for follow-up examination after completed treatment for conditions other than malignant neoplasm: Secondary | ICD-10-CM

## 2020-12-03 DIAGNOSIS — K432 Incisional hernia without obstruction or gangrene: Secondary | ICD-10-CM

## 2020-12-03 DIAGNOSIS — K811 Chronic cholecystitis: Secondary | ICD-10-CM

## 2020-12-03 MED ORDER — NYSTATIN-TRIAMCINOLONE 100000-0.1 UNIT/GM-% EX OINT: 1.0000 "application " | TOPICAL_OINTMENT | Freq: Four times a day (QID) | CUTANEOUS | 0 refills | Status: DC

## 2020-12-03 NOTE — Patient Instructions (Addendum)
Please pickup prescription at your pharmacy.    Try spending time in the shower to work the Dermabond off.   If you have any concerns or questions, please feel free to call our office. Keep follow up appointment on 12/10/2020.   GENERAL POST-OPERATIVE PATIENT INSTRUCTIONS   WOUND CARE INSTRUCTIONS:  Keep a dry clean dressing on the wound if there is drainage. The initial bandage may be removed after 24 hours.  Once the wound has quit draining you may leave it open to air.  If clothing rubs against the wound or causes irritation and the wound is not draining you may cover it with a dry dressing during the daytime.  Try to keep the wound dry and avoid ointments on the wound unless directed to do so.  If the wound becomes bright red and painful or starts to drain infected material that is not clear, please contact your physician immediately.  If the wound is mildly pink and has a thick firm ridge underneath it, this is normal, and is referred to as a healing ridge.  This will resolve over the next 4-6 weeks.  BATHING: You may shower if you have been informed of this by your surgeon. However, Please do not submerge in a tub, hot tub, or pool until incisions are completely sealed or have been told by your surgeon that you may do so.  DIET:  You may eat any foods that you can tolerate.  It is a good idea to eat a high fiber diet and take in plenty of fluids to prevent constipation.  If you do become constipated you may want to take a mild laxative or take ducolax tablets on a daily basis until your bowel habits are regular.  Constipation can be very uncomfortable, along with straining, after recent surgery.  ACTIVITY:  You are encouraged to cough and deep breath or use your incentive spirometer if you were given one, every 15-30 minutes when awake.  This will help prevent respiratory complications and low grade fevers post-operatively if you had a general anesthetic.  You may want to hug a pillow when  coughing and sneezing to add additional support to the surgical area, if you had abdominal or chest surgery, which will decrease pain during these times.  You are encouraged to walk and engage in light activity for the next two weeks.  You should not lift more than 10-15 pounds, until 12/25/2020 as it could put you at increased risk for complications.  Twenty pounds is roughly equivalent to a plastic bag of groceries. At that time- Listen to your body when lifting, if you have pain when lifting, stop and then try again in a few days. Soreness after doing exercises or activities of daily living is normal as you get back in to your normal routine.  MEDICATIONS:  Try to take narcotic medications and anti-inflammatory medications, such as tylenol, ibuprofen, naprosyn, etc., with food.  This will minimize stomach upset from the medication.  Should you develop nausea and vomiting from the pain medication, or develop a rash, please discontinue the medication and contact your physician.  You should not drive, make important decisions, or operate machinery when taking narcotic pain medication.  SUNBLOCK Use sun block to incision area over the next year if this area will be exposed to sun. This helps decrease scarring and will allow you avoid a permanent darkened area over your incision.   Gallbladder Eating Plan  If you have a gallbladder condition, you may have  trouble digesting fats. Eating a low-fat diet can help reduce your symptoms, and may be helpful before and after having surgery to remove your gallbladder (cholecystectomy). Your health care provider may recommend that you work with a diet and nutrition specialist (dietitian) to help you reduce the amount of fat in your diet. What are tips for following this plan? General guidelines Limit your fat intake to less than 30% of your total daily calories. If you eat around 1,800 calories each day, this is less than 60 grams (g) of fat per day. Fat is an  important part of a healthy diet. Eating a low-fat diet can make it hard to maintain a healthy body weight. Ask your dietitian how much fat, calories, and other nutrients you need each day. Eat small, frequent meals throughout the day instead of three large meals. Drink at least 8-10 cups of fluid a day. Drink enough fluid to keep your urine clear or pale yellow. Limit alcohol intake to no more than 1 drink a day for nonpregnant women and 2 drinks a day for men. One drink equals 12 oz of beer, 5 oz of wine, or 1 oz of hard liquor. Reading food labels  Check Nutrition Facts on food labels for the amount of fat per serving. Choose foods with less than 3 grams of fat per serving. Shopping Choose nonfat and low-fat healthy foods. Look for the words "nonfat," "low fat," or "fat free." Avoid buying processed or prepackaged foods. Cooking Cook using low-fat methods, such as baking, broiling, grilling, or boiling. Cook with small amounts of healthy fats, such as olive oil, grapeseed oil, canola oil, or sunflower oil. What foods are recommended? All fresh, frozen, or canned fruits and vegetables. Whole grains. Low-fat or non-fat (skim) milk and yogurt. Lean meat, skinless poultry, fish, eggs, and beans. Low-fat protein supplement powders or drinks. Spices and herbs. What foods are not recommended? High-fat foods. These include baked goods, fast food, fatty cuts of meat, ice cream, french toast, sweet rolls, pizza, cheese bread, foods covered with butter, creamy sauces, or cheese. Fried foods. These include french fries, tempura, battered fish, breaded chicken, fried breads, and sweets. Foods with strong odors. Foods that cause bloating and gas. Summary A low-fat diet can be helpful if you have a gallbladder condition, or before and after gallbladder surgery. Limit your fat intake to less than 30% of your total daily calories. This is about 60 g of fat if you eat 1,800 calories each day. Eat  small, frequent meals throughout the day instead of three large meals. This information is not intended to replace advice given to you by your health care provider. Make sure you discuss any questions you have with your health care provider. Document Revised: 09/26/2019 Document Reviewed: 09/26/2019 Elsevier Patient Education  2022 [employer].

## 2020-12-03 NOTE — Progress Notes (Signed)
[facility] POST-OP OFFICE VISIT  12/03/2020  HPI: Nichole Mcneil is a 40 y.o. female 6 days s/p robotic assisted laparoscopic cholecystectomy and incisional hernia repair with Dr Christian Mate  She is overall doing very well since surgery. However, she reports on Tuesday, she began to notice a rash and significant itching around all the laparoscopic sites. She has tried 25 mg PO benadryl BID and hydrocortisone cream without significant improvement. She had no known allergies to dermabond previously. She otherwise is having minimal and manageable pain. No fever, chills, nausea, emesis, or bowel changes. Tolerating PO. She had been wearing her abdominal binder but stopped due to her rash and itching. She is following post-op recommendations otherwise.   Vital signs: BP 104/66   Pulse 82   Temp 98.3 F (36.8 C) (Oral)   Ht 4\' 11"  (1.499 m)   Wt 124 lb 12.8 oz (56.6 kg)   LMP 04/22/2019   SpO2 98%   BMI 25.21 kg/m    Physical Exam: Constitutional: Well appearing  Abdomen: Soft, incisional soreness, non-distended, no rebound or guarding Skin: All her laparoscopic incisions are healing well however on all of them she is having a urticarial rash, no erythema or drainage, these do not appear infected.   Assessment/Plan: This is a 40 y.o. female 6 days s/p robotic assisted laparoscopic cholecystectomy and incisional hernia repair   - Suspect she is having allergic reaction to the dermabond. Encouraged her to try and gently wash this off in the shower. She can continue PO benadryl as needed. We will also send her in nystatin-triamcinolone cream to help  - Continue pain control prn  - Continue abdominal binder as needed  - Reviewed lifting restrictions  - Reviewed surgical pathology; [place]  - She will follow up next week as scheduled   -- Edison Simon, PA-C [facility] 12/03/2020, 10:54 AM [phone] M-F: 7am - 4pm

## 2020-12-10 ENCOUNTER — Encounter: Payer: Self-pay | Admitting: Surgery

## 2020-12-10 ENCOUNTER — Ambulatory Visit (INDEPENDENT_AMBULATORY_CARE_PROVIDER_SITE_OTHER): Payer: BC Managed Care – PPO | Admitting: Surgery

## 2020-12-10 ENCOUNTER — Other Ambulatory Visit: Payer: Self-pay

## 2020-12-10 VITALS — BP 104/69 | HR 63 | Temp 98.3°F | Ht 59.0 in | Wt 125.6 lb

## 2020-12-10 DIAGNOSIS — Z09 Encounter for follow-up examination after completed treatment for conditions other than malignant neoplasm: Secondary | ICD-10-CM

## 2020-12-10 NOTE — Progress Notes (Signed)
[facility] POST-OP OFFICE VISIT  12/10/2020  HPI: Nichole Mcneil is a 40 y.o. female 13 days s/p robotic cholecystectomy with incisional hernia repair with mesh.  She denies fevers chills.  Denies pain.  With the exception of some small pain at her subcostal incision.  Denies nausea vomiting.  Reports good appetite  Vital signs: BP 104/69   Pulse 63   Temp 98.3 F (36.8 C)   Ht 4\' 11"  (1.499 m)   Wt 125 lb 9.6 oz (57 kg)   LMP 04/22/2019   SpO2 99%   BMI 25.37 kg/m    Physical Exam: Constitutional: She appears well, moves quite readily sitting up and laying down. Abdomen: Hernia repair appears to be intact.  She is otherwise soft nontender. Skin: Skin incisions appear to be well-healed.  Assessment/Plan: This is a 40 y.o. female 13 days s/p robotic cholecystectomy with incisional hernia repair.  Patient Active Problem List   Diagnosis Date Noted   Chronic cholecystitis 11/12/2020   Incisional hernia, without obstruction or gangrene 10/16/2020   HTN (hypertension) 07/23/2019   Constipation 09/23/2016   Oligomenorrhea 09/23/2016   Environmental allergies 02/20/2015   Shingles 06/16/2014   Gout 11/04/2013   Migraines 11/04/2013   Left foot pain 10/08/2013   Pes cavus, congenital 10/08/2013   Premenstrual dysphoric syndrome 08/28/2011   Anal or rectal pain 08/28/2011   Charcot-Marie-Tooth disease    Muscular dystrophy ([place])     -We reviewed caution with lifting and activity, I feel she can resume riding a bicycle.  Otherwise follow-up as needed.   [name] M.D., FACS 12/10/2020, 11:11 AM

## 2020-12-10 NOTE — Patient Instructions (Addendum)
You may take a bath. You may start cycling exercise.   You may stop the Nystatin  Follow-up with our office as needed.  Please call and ask to speak with a nurse if you develop questions or concerns.   GENERAL POST-OPERATIVE PATIENT INSTRUCTIONS   WOUND CARE INSTRUCTIONS:  Keep a dry clean dressing on the wound if there is drainage. The initial bandage may be removed after 24 hours.  Once the wound has quit draining you may leave it open to air.  If clothing rubs against the wound or causes irritation and the wound is not draining you may cover it with a dry dressing during the daytime.  Try to keep the wound dry and avoid ointments on the wound unless directed to do so.  If the wound becomes bright red and painful or starts to drain infected material that is not clear, please contact your physician immediately.  If the wound is mildly pink and has a thick firm ridge underneath it, this is normal, and is referred to as a healing ridge.  This will resolve over the next 4-6 weeks.  BATHING: You may shower if you have been informed of this by your surgeon. However, Please do not submerge in a tub, hot tub, or pool until incisions are completely sealed or have been told by your surgeon that you may do so.  DIET:  You may eat any foods that you can tolerate.  It is a good idea to eat a high fiber diet and take in plenty of fluids to prevent constipation.  If you do become constipated you may want to take a mild laxative or take ducolax tablets on a daily basis until your bowel habits are regular.  Constipation can be very uncomfortable, along with straining, after recent surgery.  ACTIVITY:  You are encouraged to cough and deep breath or use your incentive spirometer if you were given one, every 15-30 minutes when awake.  This will help prevent respiratory complications and low grade fevers post-operatively if you had a general anesthetic.  You may want to hug a pillow when coughing and sneezing to add  additional support to the surgical area, if you had abdominal or chest surgery, which will decrease pain during these times.  You are encouraged to walk and engage in light activity for the next two weeks.  You should not lift more than 20 pounds for 6 weeks after surgery as it could put you at increased risk for complications.  Twenty pounds is roughly equivalent to a plastic bag of groceries. At that time- Listen to your body when lifting, if you have pain when lifting, stop and then try again in a few days. Soreness after doing exercises or activities of daily living is normal as you get back in to your normal routine.  MEDICATIONS:  Try to take narcotic medications and anti-inflammatory medications, such as tylenol, ibuprofen, naprosyn, etc., with food.  This will minimize stomach upset from the medication.  Should you develop nausea and vomiting from the pain medication, or develop a rash, please discontinue the medication and contact your physician.  You should not drive, make important decisions, or operate machinery when taking narcotic pain medication.  SUNBLOCK Use sun block to incision area over the next year if this area will be exposed to sun. This helps decrease scarring and will allow you avoid a permanent darkened area over your incision.  QUESTIONS:  Please feel free to call our office if you have any  questions, and we will be glad to assist you.

## 2020-12-25 ENCOUNTER — Other Ambulatory Visit: Payer: Self-pay | Admitting: Family Medicine

## 2020-12-25 ENCOUNTER — Other Ambulatory Visit: Payer: Self-pay | Admitting: Podiatry

## 2021-03-02 ENCOUNTER — Other Ambulatory Visit: Payer: Self-pay | Admitting: Podiatry

## 2021-03-03 DIAGNOSIS — N951 Menopausal and female climacteric states: Secondary | ICD-10-CM | POA: Diagnosis not present

## 2021-03-03 DIAGNOSIS — R5383 Other fatigue: Secondary | ICD-10-CM | POA: Diagnosis not present

## 2021-03-03 NOTE — Telephone Encounter (Signed)
Please advise 

## 2021-03-09 DIAGNOSIS — N951 Menopausal and female climacteric states: Secondary | ICD-10-CM | POA: Diagnosis not present

## 2021-03-09 DIAGNOSIS — F419 Anxiety disorder, unspecified: Secondary | ICD-10-CM | POA: Diagnosis not present

## 2021-03-09 DIAGNOSIS — R454 Irritability and anger: Secondary | ICD-10-CM | POA: Diagnosis not present

## 2021-03-09 DIAGNOSIS — R232 Flushing: Secondary | ICD-10-CM | POA: Diagnosis not present

## 2021-03-15 DIAGNOSIS — Z6827 Body mass index (BMI) 27.0-27.9, adult: Secondary | ICD-10-CM | POA: Diagnosis not present

## 2021-03-15 DIAGNOSIS — Z01419 Encounter for gynecological examination (general) (routine) without abnormal findings: Secondary | ICD-10-CM | POA: Diagnosis not present

## 2021-04-02 ENCOUNTER — Other Ambulatory Visit: Payer: Self-pay | Admitting: Family Medicine

## 2021-04-26 ENCOUNTER — Encounter: Payer: Self-pay | Admitting: Family Medicine

## 2021-04-26 NOTE — Telephone Encounter (Signed)
Please set up an in person OV with me this week  ?

## 2021-04-30 ENCOUNTER — Ambulatory Visit: Payer: BC Managed Care – PPO | Admitting: Family Medicine

## 2021-05-18 ENCOUNTER — Other Ambulatory Visit: Payer: Self-pay | Admitting: Podiatry

## 2021-05-31 ENCOUNTER — Telehealth: Payer: Self-pay | Admitting: Podiatry

## 2021-05-31 ENCOUNTER — Other Ambulatory Visit: Payer: Self-pay | Admitting: Podiatry

## 2021-05-31 NOTE — Telephone Encounter (Signed)
I checked and there was a refill on her meloxicam but she will keep her appt for 4.25. in [place]. ?

## 2021-05-31 NOTE — Telephone Encounter (Signed)
Pt called and is scheduled to see Dr Rebekah Chesterfield on 4.25 in [place] but she is out of her meloxicam and has been for over a week and can tell. She has not been seen by md since 2021 could we send in a refill  ?

## 2021-06-04 ENCOUNTER — Encounter: Payer: Self-pay | Admitting: Physician Assistant

## 2021-06-04 ENCOUNTER — Ambulatory Visit (INDEPENDENT_AMBULATORY_CARE_PROVIDER_SITE_OTHER): Payer: BC Managed Care – PPO | Admitting: Physician Assistant

## 2021-06-04 VITALS — BP 103/67 | HR 61 | Ht 59.0 in | Wt 134.0 lb

## 2021-06-04 DIAGNOSIS — R310 Gross hematuria: Secondary | ICD-10-CM

## 2021-06-04 LAB — URINALYSIS, COMPLETE
Bilirubin, UA: NEGATIVE
Glucose, UA: NEGATIVE
Ketones, UA: NEGATIVE
Leukocytes,UA: NEGATIVE
Nitrite, UA: NEGATIVE
Protein,UA: NEGATIVE
RBC, UA: NEGATIVE
Specific Gravity, UA: 1.025 (ref 1.005–1.030)
Urobilinogen, Ur: 0.2 mg/dL (ref 0.2–1.0)
pH, UA: 6.5 (ref 5.0–7.5)

## 2021-06-04 LAB — MICROSCOPIC EXAMINATION: Bacteria, UA: NONE SEEN

## 2021-06-04 LAB — BLADDER SCAN AMB NON-IMAGING

## 2021-06-04 NOTE — Patient Instructions (Signed)

## 2021-06-04 NOTE — Progress Notes (Signed)
? ?06/04/2021 ?11:58 AM  ? ?Nichole Mcneil ?May 22, 1980 ?[id number] ? ?CC: ?Chief Complaint  ?Patient presents with  ? Follow-up  ? Hematuria  ? ?HPI: ?Nichole Mcneil is a 41 y.o. female with PMH right renal mass on surveillance who presents today for evaluation of possible UTI.  ? ?Today she reports an approximate 1 week history of lower abdominal bloating, bilateral back pain, and urethral burning with and without urination.  She also reports an episode of gross hematuria 2 days ago associated with elevated body temp, both of which have since resolved.  She never saw a stone pass.  She has been taking cranberry tablets and drinking 80 to 90 ounces of water daily.  She is s/p hysterectomy and does not have a history of back problems. ? ?On CTAP without contrast dated 10/30/2020, no evidence of urolithiasis. ? ?In-office UA and microscopy today pan negative. PVR 73m. ? ?PMH: ?Past Medical History:  ?Diagnosis Date  ? Charcot Marie Tooth muscular atrophy   ? Migraines   ? has gotten better since hysterectomy  ? Muscular dystrophy ([facility]   ? at age 41  ? Muscular dystrophy ([facility]   ? ? ?Surgical History: ?Past Surgical History:  ?Procedure Laterality Date  ? ABDOMINAL HYSTERECTOMY    ? INCISIONAL HERNIA REPAIR N/A 11/27/2020  ? Procedure: HERNIA REPAIR INCISIONAL;  Surgeon: RRonny Bacon MD;  Location: [facility] ORS;  Service: General;  Laterality: N/A;  ? knee surgery    ? TUBAL LIGATION    ? ? ?Home Medications:  ?Allergies as of 06/04/2021   ? ?   Reactions  ? Macrolides And Ketolides Hives  ? Shellfish Allergy Hives, Swelling  ? Sulfa Antibiotics Hives  ? Other reaction(s): Unknown  ? Other Itching, Rash  ? Dermabond  ? ?  ? ?  ?Medication List  ?  ? ?  ? Accurate as of June 04, 2021 11:58 AM. If you have any questions, ask your nurse or doctor.  ?  ?  ? ?  ? ?BIOTIN PO ?Take 2 capsules by mouth daily. ?  ?conjugated estrogens vaginal cream ?Commonly known as: PREMARIN ?Place 1 applicator vaginally daily. ?   ?diphenhydrAMINE 25 MG tablet ?Commonly known as: BENADRYL ?Take 25 mg by mouth at bedtime. ?  ?Magnesium Carbonate Powd ?Take 1 Scoop by mouth daily. ?  ?meloxicam 15 MG tablet ?Commonly known as: MOBIC ?TAKE 1 TABLET BY MOUTH ONCE A DAY ?  ?nystatin-triamcinolone ointment ?Commonly known as: MYCOLOG ?Apply 1 application topically 4 (four) times daily. ?  ?progesterone 100 MG capsule ?Commonly known as: PROMETRIUM ?Take 100 mg by mouth at bedtime. ?  ?propranolol 40 MG tablet ?Commonly known as: INDERAL ?TAKE 1 TABLET BY MOUTH ONCE A DAY ?  ?TAntonOP ?Place 1 drop into both eyes 2 (two) times daily. ?  ?traMADol 50 MG tablet ?Commonly known as: Ultram ?Take 1 tablet (50 mg total) by mouth every 6 (six) hours as needed. ?  ?vitamin C 1000 MG tablet ?Take 1,000 mg by mouth daily. ?  ? ?  ? ? ?Allergies:  ?Allergies  ?Allergen Reactions  ? Macrolides And Ketolides Hives  ? Shellfish Allergy Hives and Swelling  ? Sulfa Antibiotics Hives  ?  Other reaction(s): Unknown  ? Other Itching and Rash  ?  Dermabond  ? ? ?Family History: ?Family History  ?Problem Relation Age of Onset  ? Muscular dystrophy Mother   ? Hyperlipidemia Mother   ? Endometriosis Mother   ? Migraines  Father   ? Nephrolithiasis Father   ? Hypertension Father   ? Arthritis Brother   ? Aneurysm Maternal Uncle   ? Stroke Maternal Grandmother   ? Aneurysm Maternal Grandfather   ? Migraines Paternal Grandmother   ? Cancer Paternal Grandfather   ?     lung  ? ? ?Social History:  ? reports that she has never smoked. She has never been exposed to tobacco smoke. She has never used smokeless tobacco. She reports current alcohol use of about 2.0 standard drinks per week. She reports that she does not use drugs. ? ?Physical Exam: ?BP 103/67   Pulse 61   Ht '4\' 11"'$  (1.499 m)   Wt 134 lb (60.8 kg)   LMP 04/22/2019   BMI 27.06 kg/m?   ?Constitutional:  Alert and oriented, no acute distress, nontoxic appearing ?HEENT: [place], AT ?Cardiovascular: No clubbing,  cyanosis, or edema ?Respiratory: Normal respiratory effort, no increased work of breathing ?GU: No CVA tenderness ?Skin: No rashes, bruises or suspicious lesions ?Neurologic: Grossly intact, no focal deficits, moving all 4 extremities ?Psychiatric: Normal mood and affect ? ?Laboratory Data: ?Results for orders placed or performed in visit on 06/04/21  ?Microscopic Examination  ? Urine  ?Result Value Ref Range  ? WBC, UA 0-5 0 - 5 /hpf  ? RBC 0-2 0 - 2 /hpf  ? Epithelial Cells (non renal) 0-10 0 - 10 /hpf  ? Bacteria, UA None seen None seen/Few  ?Urinalysis, Complete  ?Result Value Ref Range  ? Specific Gravity, UA 1.025 1.005 - 1.030  ? pH, UA 6.5 5.0 - 7.5  ? Color, UA Yellow Yellow  ? Appearance Ur Clear Clear  ? Leukocytes,UA Negative Negative  ? Protein,UA Negative Negative/Trace  ? Glucose, UA Negative Negative  ? Ketones, UA Negative Negative  ? RBC, UA Negative Negative  ? Bilirubin, UA Negative Negative  ? Urobilinogen, Ur 0.2 0.2 - 1.0 mg/dL  ? Nitrite, UA Negative Negative  ? Microscopic Examination See below:   ?BLADDER SCAN AMB NON-IMAGING  ?Result Value Ref Range  ? Scan Result 76m   ? ?Assessment & Plan:   ?1. Gross hematuria ?UA is bland today, will send for culture to confirm that I do not suspect this will have any significant growth.  She is emptying her bladder appropriately.  Gross hematuria may be secondary to her underlying renal mass, though we discussed this reassuring that the bleeding is stopped on its own.  Recommend cystoscopy to rule out underlying bladder pathology.  Patient is in agreement with this plan. ?- Urinalysis, Complete ?- BLADDER SCAN AMB NON-IMAGING ?- CULTURE, URINE COMPREHENSIVE ? ?Return in about 4 weeks (around 07/02/2021) for Cysto with Dr. BErlene Quan Will call with results. ? ?SDebroah Loop PA-C ? ?[facility] [place]?[address] ?[place] [address]?[phone]?   ?

## 2021-06-07 ENCOUNTER — Telehealth: Payer: Self-pay

## 2021-06-07 LAB — CULTURE, URINE COMPREHENSIVE

## 2021-06-07 MED ORDER — NITROFURANTOIN MONOHYD MACRO 100 MG PO CAPS: 100.0000 mg | ORAL_CAPSULE | Freq: Two times a day (BID) | ORAL | 0 refills | Status: AC

## 2021-06-07 NOTE — Telephone Encounter (Signed)
Patient returned a call, gave her the results and cx her cysto appt. She voiced understanding. ?Nichole Mcneil ?

## 2021-06-07 NOTE — Telephone Encounter (Signed)
-----   Message from Debroah Loop, [place] sent at 06/07/2021 10:26 AM EDT ----- ?Urine culture did grow some bacteria despite bland UA. Please start Macrobid '100mg'$  BID x5 days. OK to cancel cystoscopy with Dr. Erlene Quan, but if her gross hematuria returns, she should contact us so that we can schedule it again. ?----- Message ----- ?From: Interface, [facility] Lab Results In ?Sent: 06/04/2021   9:36 AM EDT ?To: Debroah Loop, PA-C ? ? ?

## 2021-06-07 NOTE — Telephone Encounter (Signed)
LMOM (OK per DPR) notifying pt of message below. Asked pt to please return call if she would like to cancel cysto appt. Rx sent to pts pharmacy.  ?

## 2021-06-15 ENCOUNTER — Ambulatory Visit (INDEPENDENT_AMBULATORY_CARE_PROVIDER_SITE_OTHER): Payer: BC Managed Care – PPO | Admitting: Podiatry

## 2021-06-15 DIAGNOSIS — M659 Synovitis and tenosynovitis, unspecified: Secondary | ICD-10-CM

## 2021-06-15 DIAGNOSIS — G600 Hereditary motor and sensory neuropathy: Secondary | ICD-10-CM | POA: Diagnosis not present

## 2021-06-15 MED ORDER — MELOXICAM 15 MG PO TABS: 15.0000 mg | ORAL_TABLET | Freq: Every day | ORAL | 3 refills | Status: AC

## 2021-06-15 NOTE — Progress Notes (Signed)
Patient ID: Nichole Mcneil, female   DOB: March 01, 1980, 41 y.o.   MRN: [id number] ? ? ?HPI: 41 year old female with a history of known Charcot-Marie-Tooth muscular atrophy presents today for follow up evaluation.  Patient states that the meloxicam helped significantly.  She requested a refill but was advised to come into the office for reevaluation prior to refill.  She presents for further treatment and evaluation.  No new complaints at this time ? ?Past Medical History:  ?Diagnosis Date  ? Charcot Marie Tooth muscular atrophy   ? Migraines   ? has gotten better since hysterectomy  ? Muscular dystrophy ([place])   ? at age 25   ? Muscular dystrophy ([place])   ? ?  ?Physical Exam: ?General: The patient is alert and oriented x3 in no acute distress. ? ?Dermatology: Skin is warm, dry and supple bilateral lower extremities. Negative for open lesions or macerations. ? ?Vascular: Palpable pedal pulses bilaterally. No erythema noted. Capillary refill within normal limits. ? ?Neurological: Epicritic and protective threshold grossly intact bilaterally.  ? ?Musculoskeletal Exam: Loss of intrinsic muscular atrophy with increased longitudinal arches bilateral. Limited range of motion noted with the subtalar joint and rear foot inversion also noted. ? ? ?Assessment: ?1. 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 ?Plan of Care:  ?1. Patient was evaluated today.  ?2.  Refill prescription for meloxicam 15 mg daily as needed ?3.  Continue wearing good supportive shoes and sneakers.  We have tried orthotics in the past but they did not help and did not work for her feet ?4.  Return to clinic as needed ? ?Works for an [employer].  ? ? ?Edrick Kins, DPM ?[place] ? ?Dr. Edrick Kins, DPM  ?  ?[address]                                        ?[place], [place] [address]                ?Office [phone]  ?Fax [phone] ? ? ? ? ?

## 2021-06-16 ENCOUNTER — Other Ambulatory Visit: Payer: BC Managed Care – PPO | Admitting: Urology

## 2021-06-30 ENCOUNTER — Other Ambulatory Visit: Payer: BC Managed Care – PPO | Admitting: Urology

## 2021-07-10 ENCOUNTER — Other Ambulatory Visit: Payer: Self-pay | Admitting: Family Medicine

## 2021-07-25 DIAGNOSIS — H2513 Age-related nuclear cataract, bilateral: Secondary | ICD-10-CM | POA: Diagnosis not present

## 2021-10-11 ENCOUNTER — Other Ambulatory Visit: Payer: Self-pay | Admitting: Family Medicine

## 2021-10-18 ENCOUNTER — Ambulatory Visit: Payer: BC Managed Care – PPO

## 2021-10-20 DIAGNOSIS — N898 Other specified noninflammatory disorders of vagina: Secondary | ICD-10-CM | POA: Diagnosis not present

## 2021-10-20 DIAGNOSIS — N760 Acute vaginitis: Secondary | ICD-10-CM | POA: Diagnosis not present

## 2021-11-08 ENCOUNTER — Other Ambulatory Visit: Payer: Self-pay

## 2021-11-08 DIAGNOSIS — N289 Disorder of kidney and ureter, unspecified: Secondary | ICD-10-CM

## 2021-11-09 ENCOUNTER — Ambulatory Visit: Payer: BC Managed Care – PPO | Admitting: Urology

## 2021-11-12 DIAGNOSIS — S8002XA Contusion of left knee, initial encounter: Secondary | ICD-10-CM | POA: Diagnosis not present

## 2021-11-12 DIAGNOSIS — S40011A Contusion of right shoulder, initial encounter: Secondary | ICD-10-CM | POA: Diagnosis not present

## 2021-11-13 ENCOUNTER — Other Ambulatory Visit: Payer: Self-pay | Admitting: Family Medicine

## 2021-11-22 ENCOUNTER — Other Ambulatory Visit: Payer: Self-pay | Admitting: Family Medicine

## 2021-11-23 ENCOUNTER — Encounter: Payer: Self-pay | Admitting: Family Medicine

## 2021-11-23 MED ORDER — PROPRANOLOL HCL 40 MG PO TABS: 40.0000 mg | ORAL_TABLET | Freq: Every day | ORAL | 0 refills | Status: DC

## 2021-11-23 MED ORDER — PROPRANOLOL HCL 40 MG PO TABS: 40.0000 mg | ORAL_TABLET | Freq: Every day | ORAL | 3 refills | Status: AC

## 2021-11-23 NOTE — Telephone Encounter (Signed)
See message f rom patient

## 2021-11-23 NOTE — Telephone Encounter (Signed)
I refilled this for a year. She does NOT need an OV immediately. Please set up an OV whenever she can find the time for it

## 2021-12-02 IMAGING — US US ABDOMEN LIMITED
1 series · 14 of 25 positions shown · non-contrast
Comparison: None.

CLINICAL DATA: Initial evaluation for intermittent right upper
quadrant pain for several months.

EXAM:
ULTRASOUND ABDOMEN LIMITED RIGHT UPPER QUADRANT

[Series 1: us abdomen limited · 0.20mm/px · 14 of 64 slices shown]
[im 1/64]
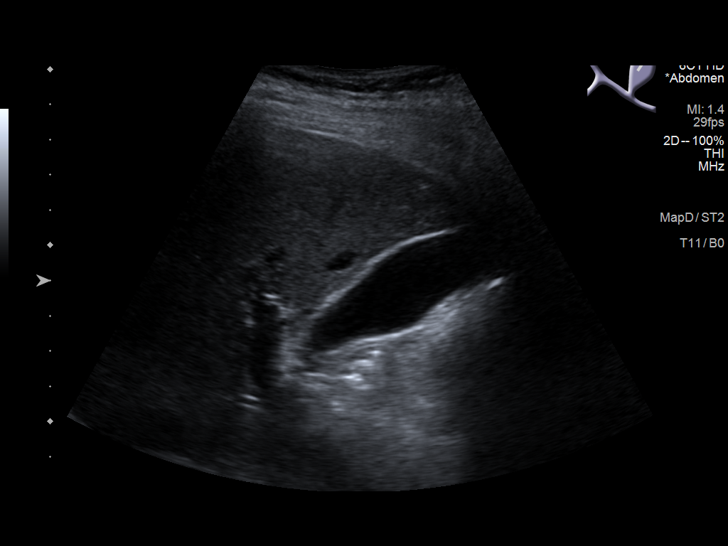
[im 6/64]
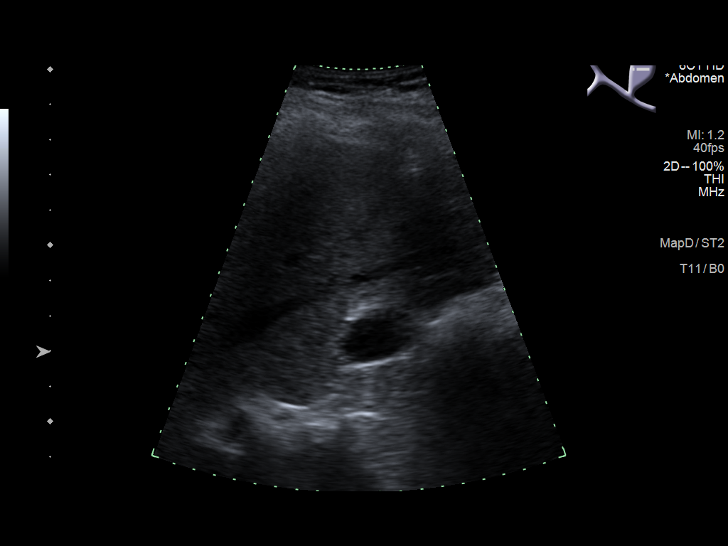
[im 11/64]
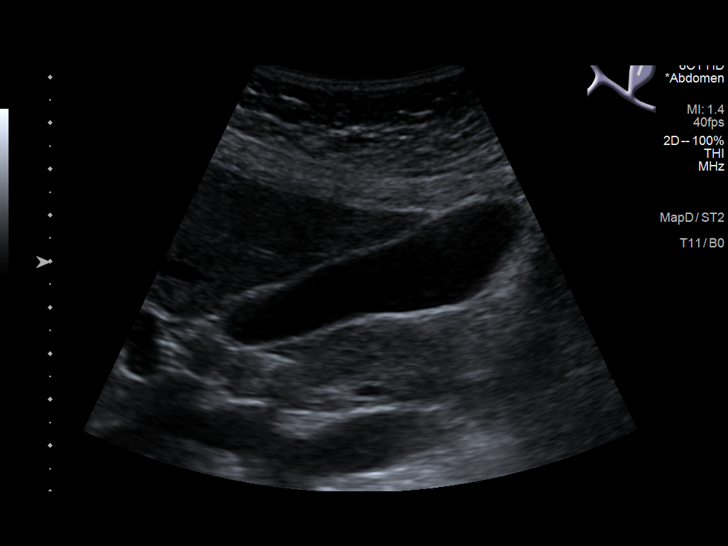
[im 16/64]
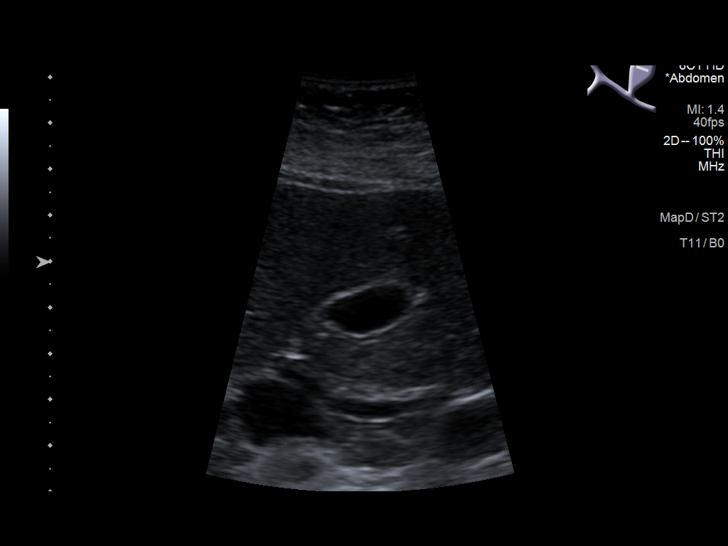
[im 22/64]
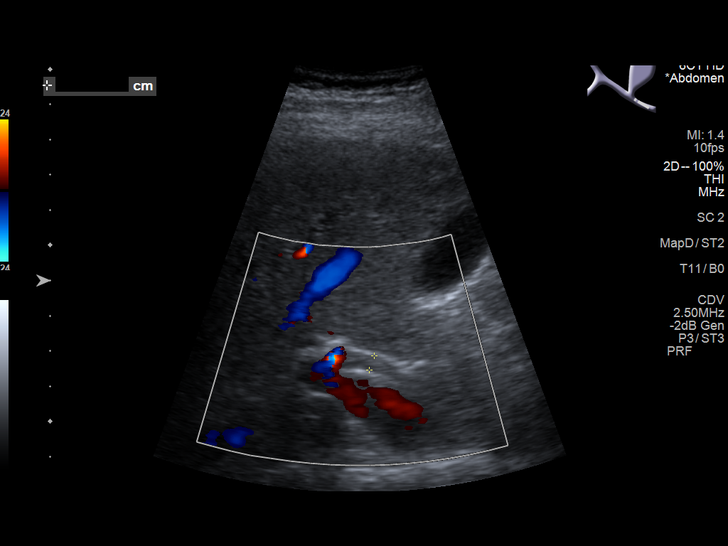
[im 24/64]
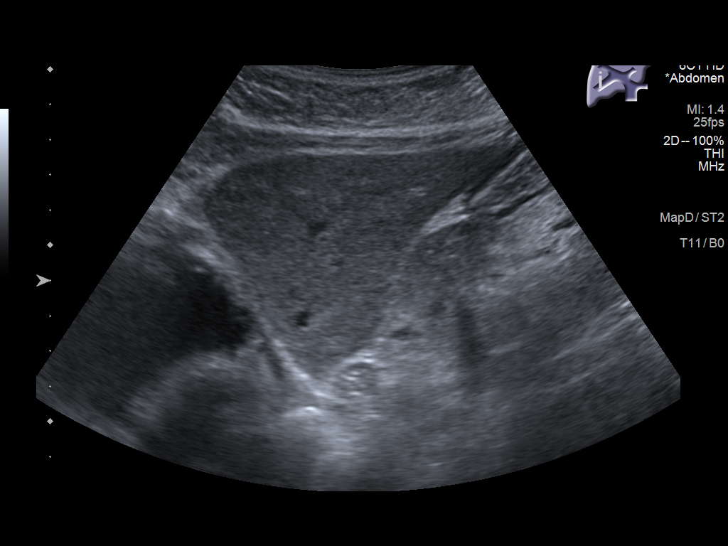
[im 29/64]
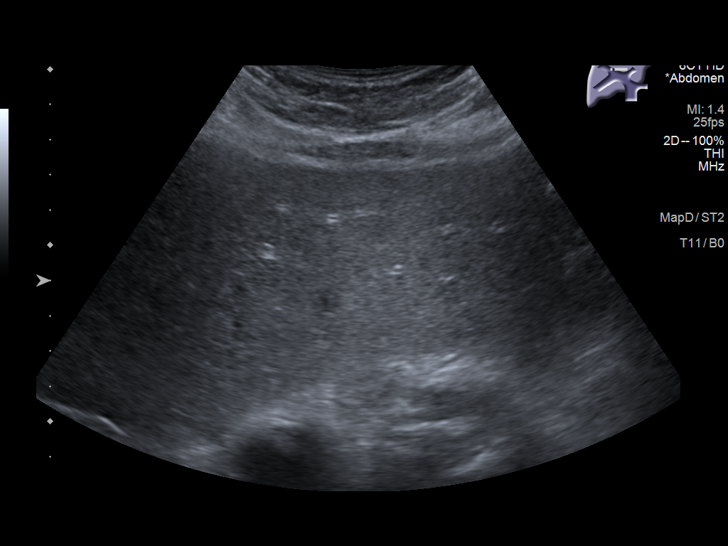
[im 35/64]
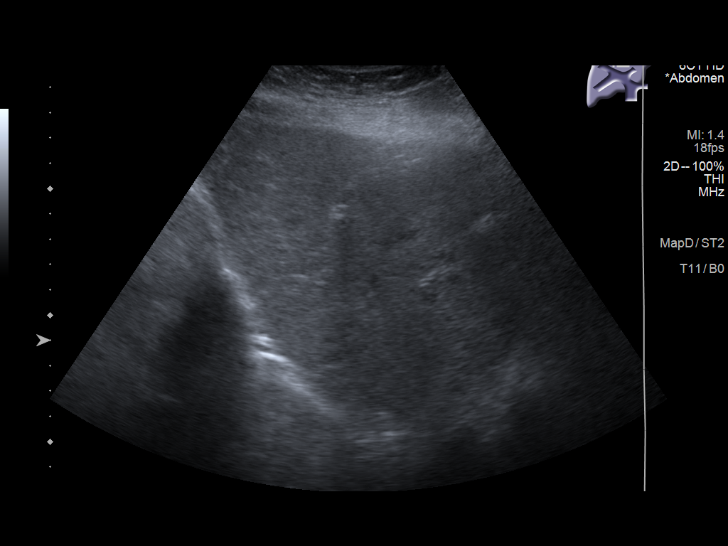
[im 40/64]
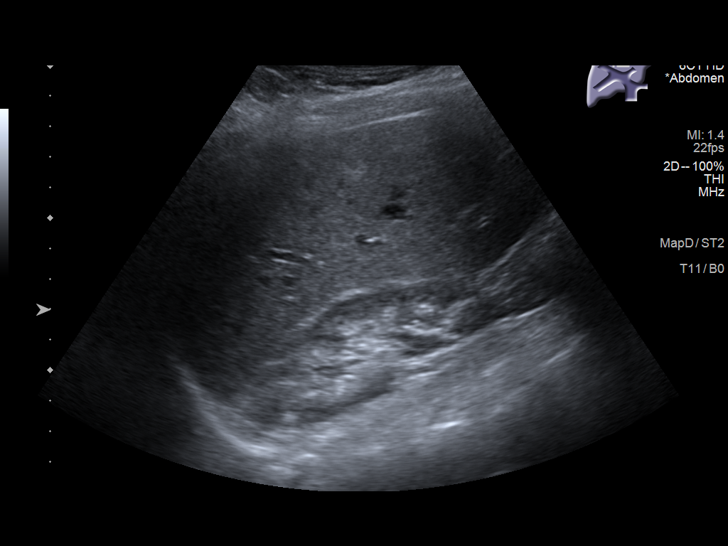
[im 43/64]
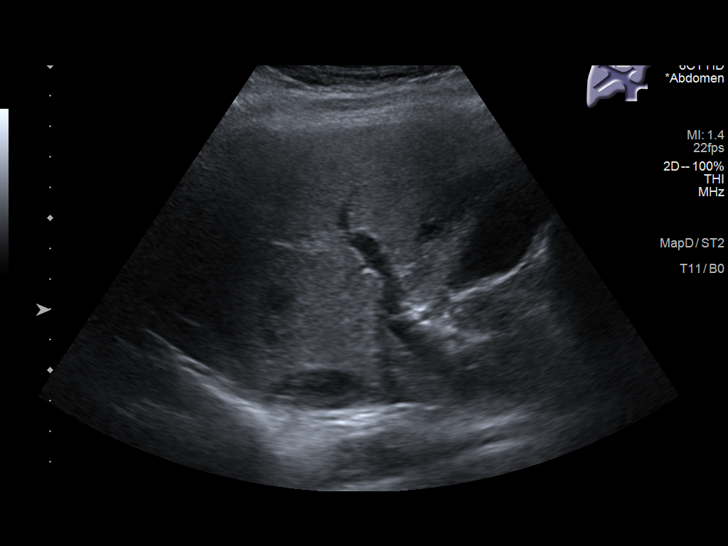
[im 48/64]
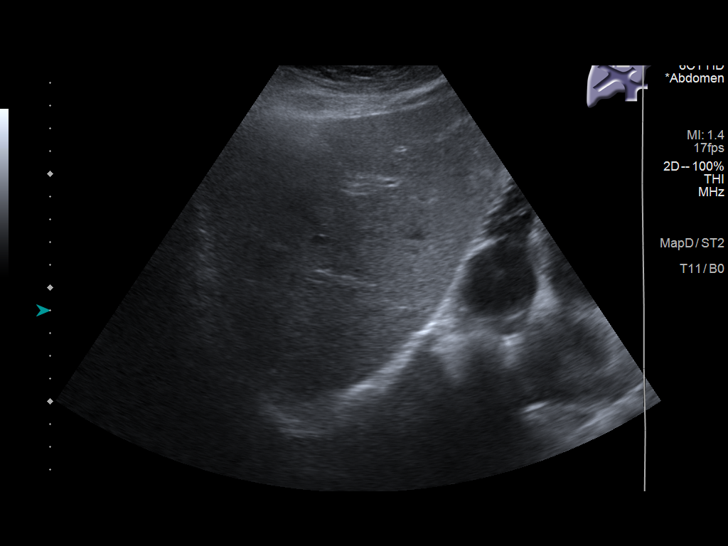
[im 53/64]
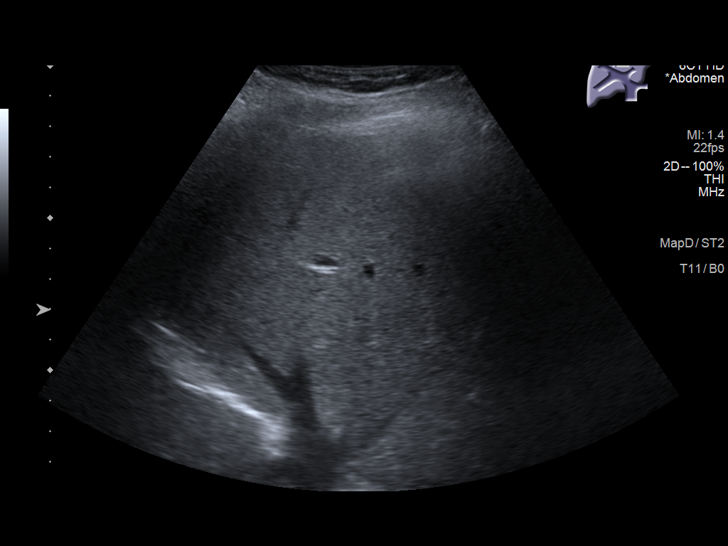
[im 58/64]
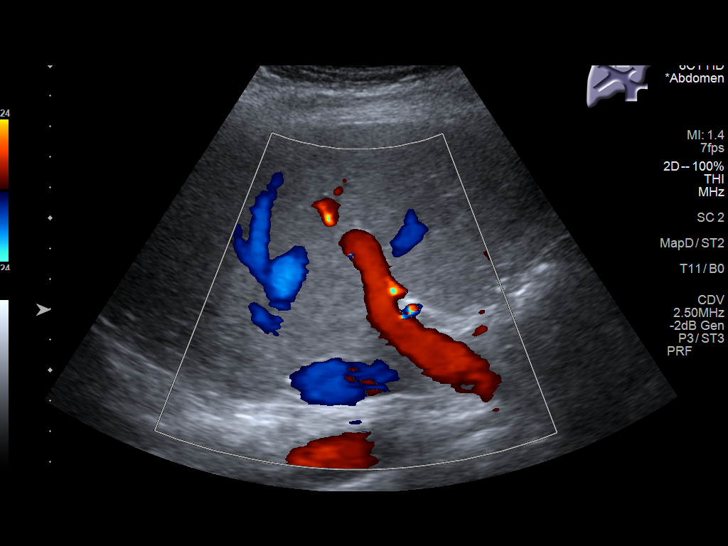
[im 64/64]
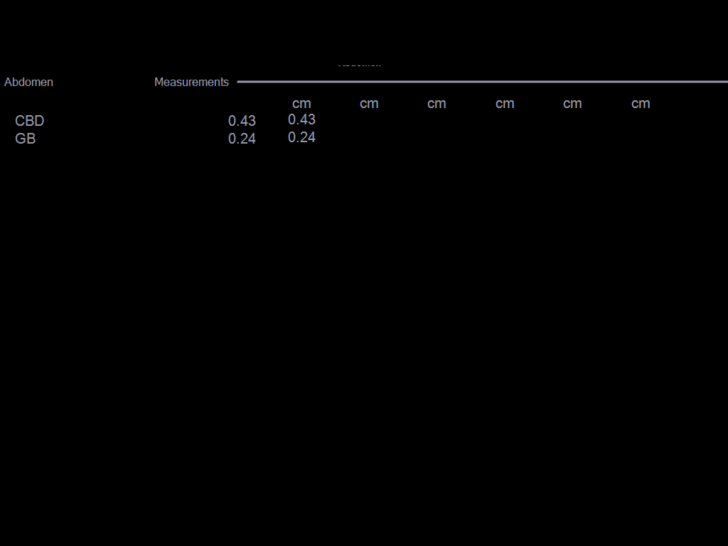

[14 of 25 positions shown; findings below may reference images not displayed]

FINDINGS: Gallbladder:

Probable small amount of echogenic sludge within the gallbladder
lumen. No frank cholelithiasis. Gallbladder wall measures within
normal limits at 2.4 mm. No free pericholecystic fluid. No
sonographic Murphy sign elicited on exam.

Common bile duct:

Diameter: 4.3 mm

Liver:

No focal lesion identified. Within normal limits in parenchymal
echogenicity. Portal vein is patent on color Doppler imaging with
normal direction of blood flow towards the liver.

Other: None.
IMPRESSION: 1. Gallbladder sludge. No frank cholelithiasis or evidence for acute
cholecystitis.
2. No biliary dilatation.
3. Normal sonographic appearance of the liver.

## 2021-12-02 IMAGING — US US RENAL
1 series · 13 of 25 positions shown · non-contrast
Comparison: CT 11/06/2013

CLINICAL DATA: Renal cyst, Cline muscular dystrophy

EXAM:
RENAL / URINARY TRACT ULTRASOUND COMPLETE

[Series 1: us renal · 0.23mm/px · 53 acquisitions, 13 frames shown]
[im 1/53]
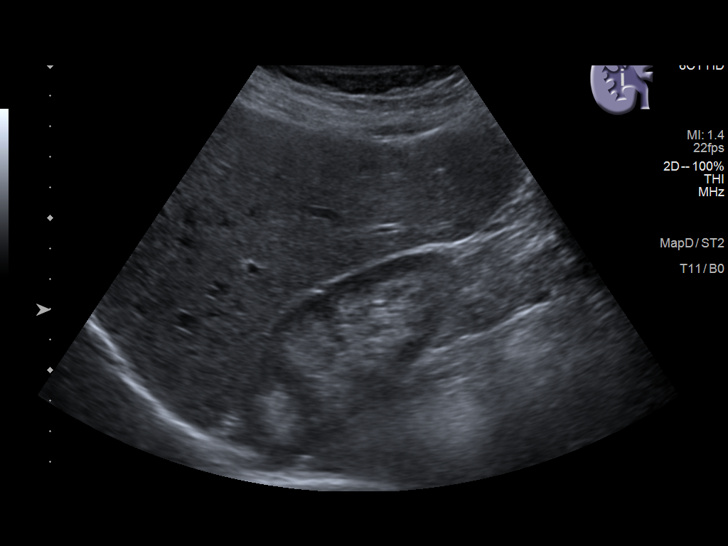
[im 5/53]
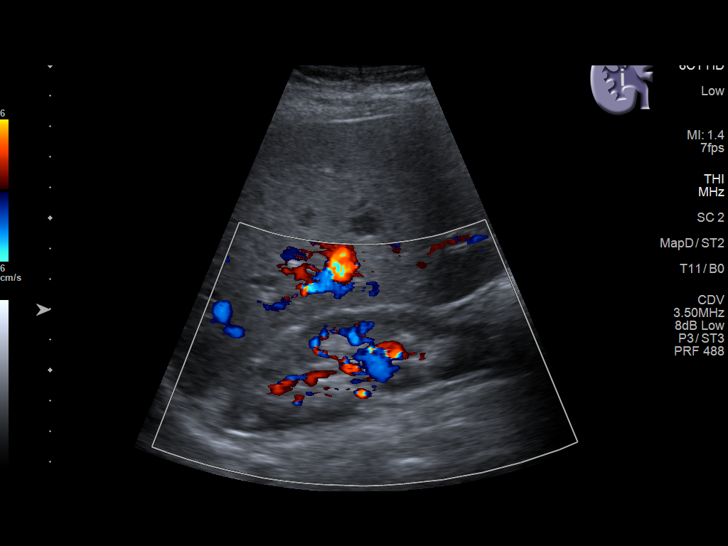
[im 9/53]
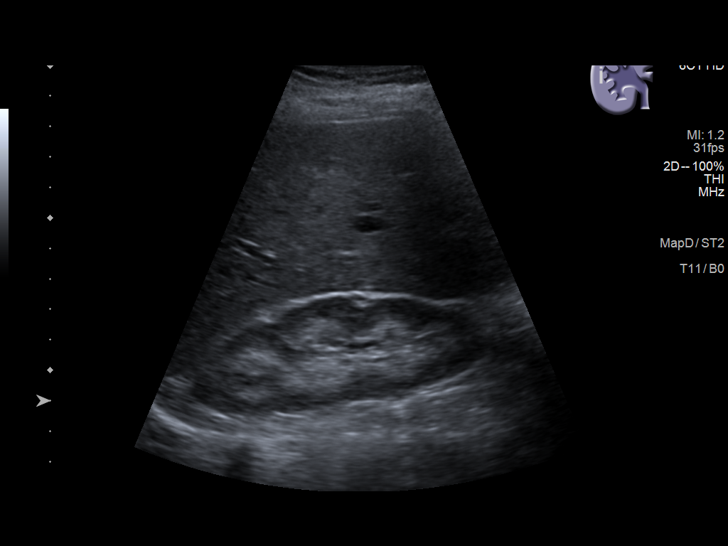
[im 14/53]
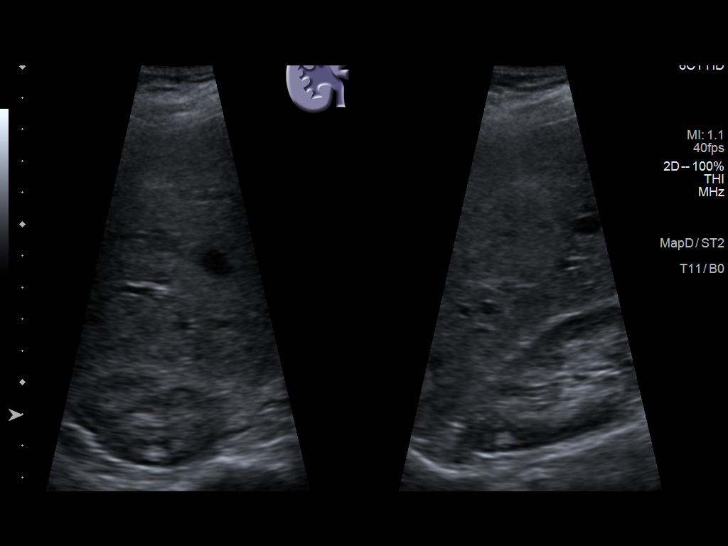
[im 18/53]
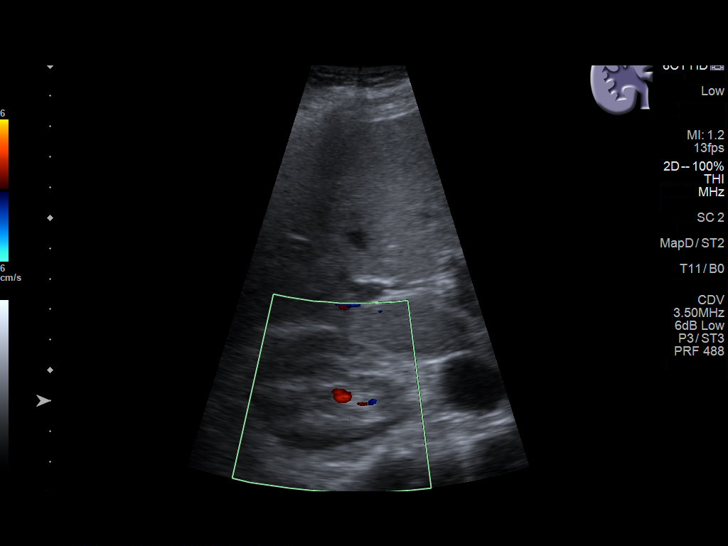
[im 22/53]
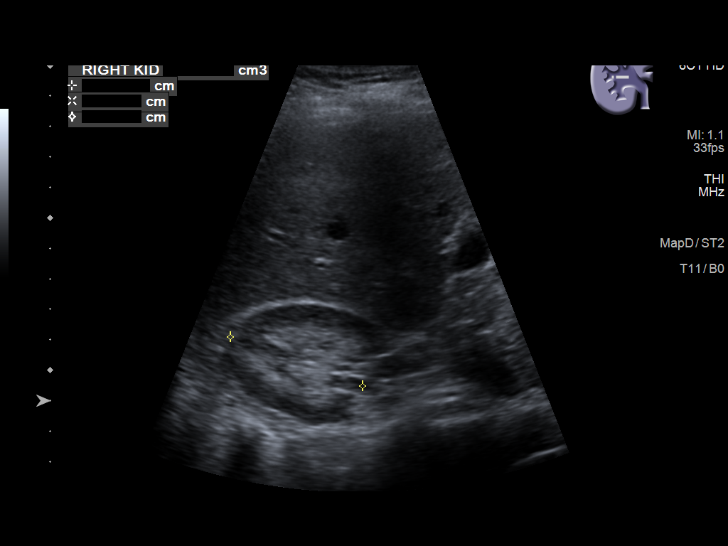
[im 27/53]
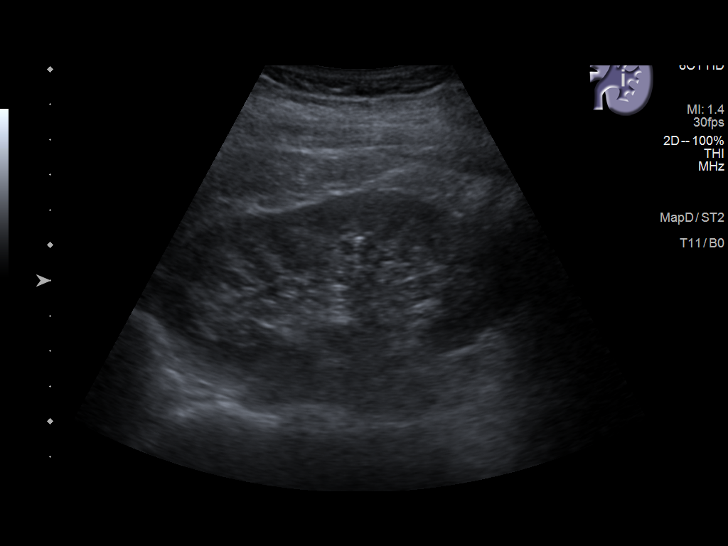
[im 31/53]
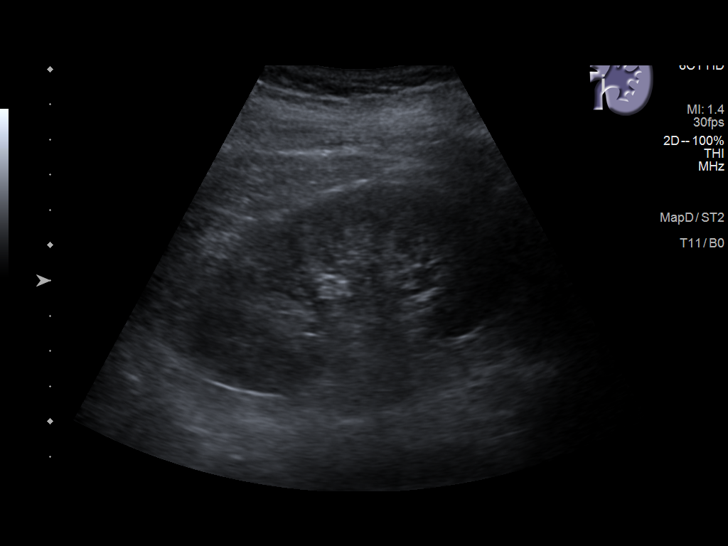
[im 35/53]
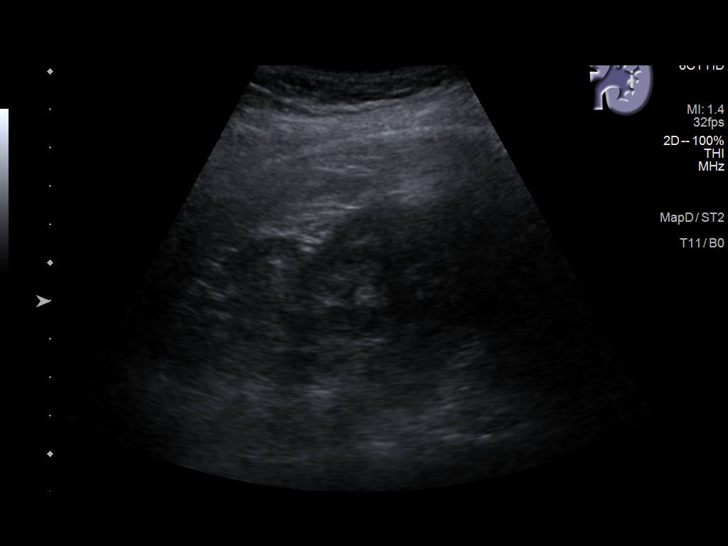
[im 40/53]
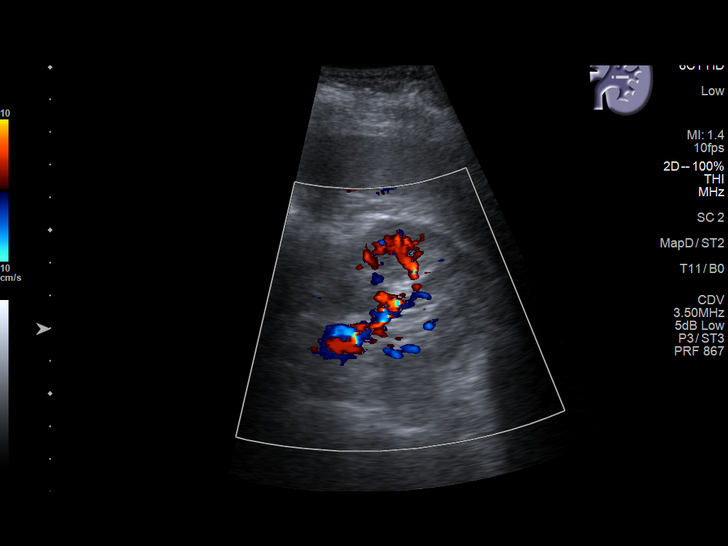
[im 44/53]
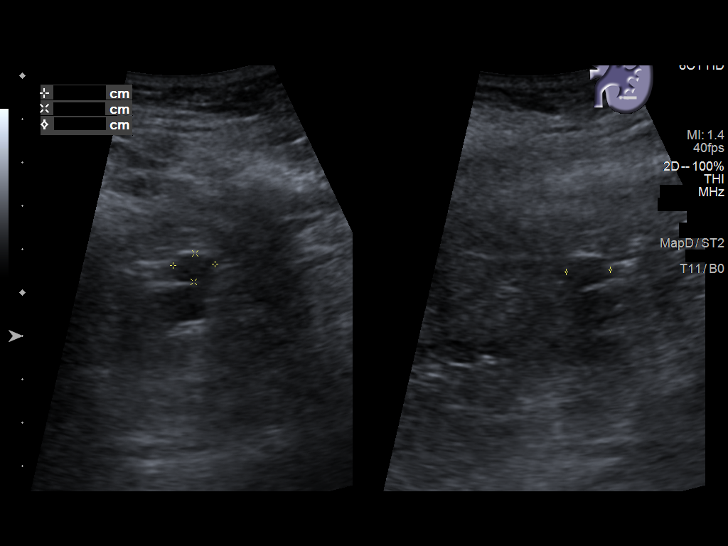
[im 48/53]
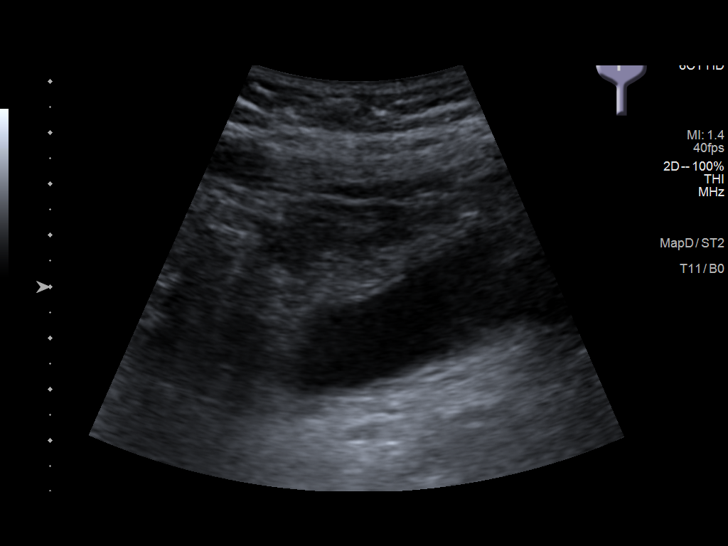
[im 53/53]
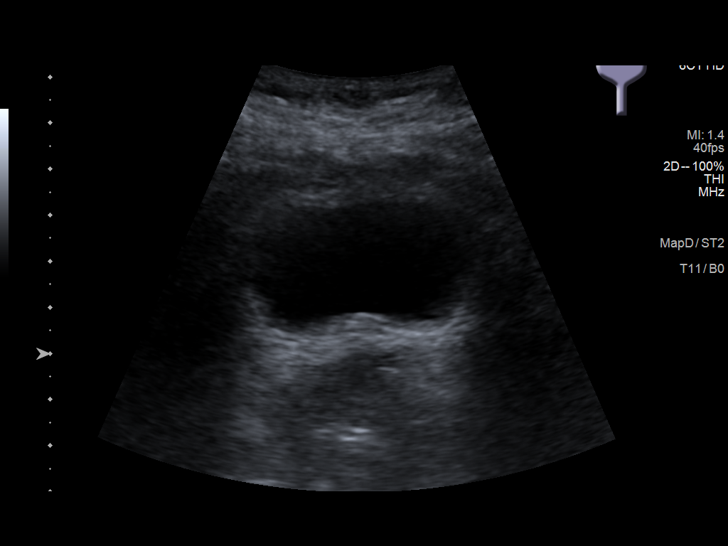

[13 of 25 positions shown; findings below may reference images not displayed]

FINDINGS: Right Kidney:

Renal measurements: 10.2 x 3.6 x 4.6 cm = volume: 87 mL. There is
mild-to-moderate renal cortical thinning. Renal cortical
echogenicity is normal. A 8 mm hyperechoic intracortical mass is
seen within the upper pole of the right kidney which is new from
prior CT examination and may represent a small angiomyolipoma. No
other intrarenal masses are seen. No intrarenal calcifications. No
hydronephrosis.

Left Kidney:

Renal measurements: 11.0 x 4.6 x 4.5 cm = volume: 119 mL. Renal
cortical thickness is preserved and cortical echogenicity is normal.
2 simple exophytic cortical cysts are seen arising from the lower
pole, measuring up to 13 mm in greatest dimension, unchanged from
prior CT examination. No solid intrarenal masses. No intrarenal
calcifications. No hydronephrosis.

Bladder:

Appears normal for degree of bladder distention.

Other:

None.
IMPRESSION: Mild to moderate right renal cortical atrophy.  No hydronephrosis.

Interval development of 8 mm hyperechoic mass within the upper pole
of the right kidney. Given the patient's age, this most likely
represents a small angiomyolipoma, though is technically
indeterminate on this examination. A follow-up sonogram could be
obtained in 1 year to document stability. Alternatively, this could
be definitively assessed with renal mass protocol CT or MRI
examination.

Stable benign cortical cysts within the lower pole of the left
kidney.

## 2022-01-11 ENCOUNTER — Ambulatory Visit: Payer: BC Managed Care – PPO | Admitting: Urology

## 2022-12-22 ENCOUNTER — Other Ambulatory Visit: Payer: Self-pay | Admitting: Family Medicine

## 2023-02-17 ENCOUNTER — Encounter: Payer: Self-pay | Admitting: Physician Assistant

## 2023-02-17 ENCOUNTER — Other Ambulatory Visit (INDEPENDENT_AMBULATORY_CARE_PROVIDER_SITE_OTHER): Payer: Self-pay

## 2023-02-17 ENCOUNTER — Ambulatory Visit (INDEPENDENT_AMBULATORY_CARE_PROVIDER_SITE_OTHER): Payer: BC Managed Care – PPO | Admitting: Physician Assistant

## 2023-02-17 DIAGNOSIS — G600 Hereditary motor and sensory neuropathy: Secondary | ICD-10-CM

## 2023-02-17 DIAGNOSIS — M25572 Pain in left ankle and joints of left foot: Secondary | ICD-10-CM | POA: Insufficient documentation

## 2023-02-17 DIAGNOSIS — M79672 Pain in left foot: Secondary | ICD-10-CM

## 2023-02-17 NOTE — Progress Notes (Signed)
Office Visit Note   Patient: Nichole Mcneil           Date of Birth: [birth year]           MRN: [id number] Visit Date: 02/17/2023              Requested by: Nelwyn Salisbury, MD [address] [place],  [place] [address] PCP: Nelwyn Salisbury, MD   Assessment & Plan: Visit Diagnoses:  1. Pain in left foot   2. Pain in left ankle and joints of left foot     Plan: Patient is a 42 year old woman with a history and familial history of Charcot-Marie-Tooth disease.  She has been seen by neurologist in the past who retired.  She has been followed by a podiatrist but was unable to get in and see them.  She did drop a object on the top of her foot about a week ago.  Still having some pain on the dorsum of the foot x-rays do not show any obvious fracture she is neurologically intact.  She is interested in management of her foot issues at some point discussing other options.  I will refer her to Dr. Lajoyce Corners for this.  Also will refer her to neurology hopefully they can manage her Charcot-Marie-Tooth via medications.  She did like to get back on meloxicam because it seemed to help her and and fine with her doing that as long as she stops any other anti-inflammatories.  May follow-up with me as needed  Follow-Up Instructions: No follow-ups on file.   Orders:  Orders Placed This Encounter  Procedures   XR Foot Complete Left   No orders of the defined types were placed in this encounter.     Procedures: No procedures performed   Clinical Data: No additional findings.   Subjective: Chief Complaint  Patient presents with   Left Foot - Injury  -Patient is a pleasant 42 year old woman presents for left foot pain after dropping a heavy item on it.  She normally sees Dr. Logan Bores foot and ankle but could not get into see him.  Happened a week ago  Injury    Review of Systems  All other systems reviewed and are negative.    Objective: Vital Signs: LMP 04/22/2019   Physical  Exam Constitutional:      Appearance: Normal appearance.  Pulmonary:     Effort: Pulmonary effort is normal.  Skin:    General: Skin is warm and dry.  Neurological:     Mental Status: She is alert.  Psychiatric:        Mood and Affect: Mood normal.        Behavior: Behavior normal.     Ortho Exam Bilateral feet she has palpable pulses on the left foot she does have some resolving bruising in the first ray.  She has characteristic findings consistent with her Charcot-Marie-Tooth.  She has no plantar ecchymosis she has no pain with manipulation of her midfoot compartments are soft and compressible skin is intact Specialty Comments:  No specialty comments available.  Imaging: No results found.   PMFS History: Patient Active Problem List   Diagnosis Date Noted   Pain in left ankle and joints of left foot 02/17/2023   Chronic cholecystitis 11/12/2020   Incisional hernia, without obstruction or gangrene 10/16/2020   HTN (hypertension) 07/23/2019   Constipation 09/23/2016   Oligomenorrhea 09/23/2016   Environmental allergies 02/20/2015   Shingles 06/16/2014   Gout 11/04/2013   Migraines 11/04/2013  Left foot pain 10/08/2013   Pes cavus, congenital 10/08/2013   Premenstrual dysphoric syndrome 08/28/2011   Anal or rectal pain 08/28/2011   Charcot-Marie-Tooth disease    Muscular dystrophy ([facility])    Past Medical History:  Diagnosis Date   Charcot Marie Tooth muscular atrophy    Migraines    has gotten better since hysterectomy   Muscular dystrophy ([facility])    at age 46    Muscular dystrophy ([facility])     Family History  Problem Relation Age of Onset   Muscular dystrophy Mother    Hyperlipidemia Mother    Endometriosis Mother    Migraines Father    Nephrolithiasis Father    Hypertension Father    Arthritis Brother    Aneurysm Maternal Uncle    Stroke Maternal Grandmother    Aneurysm Maternal Grandfather    Migraines Paternal Grandmother    Cancer Paternal Grandfather         lung    Past Surgical History:  Procedure Laterality Date   ABDOMINAL HYSTERECTOMY     INCISIONAL HERNIA REPAIR N/A 11/27/2020   Procedure: HERNIA REPAIR INCISIONAL;  Surgeon: Campbell Lerner, MD;  Location: [facility] ORS;  Service: General;  Laterality: N/A;   knee surgery     TUBAL LIGATION     Social History   Occupational History   Not on file  Tobacco Use   Smoking status: Never    Passive exposure: Never   Smokeless tobacco: Never  Substance and Sexual Activity   Alcohol use: Yes    Alcohol/week: 2.0 standard drinks of alcohol    Types: 2 Glasses of wine per week    Comment: occ   Drug use: No   Sexual activity: Yes    Birth control/protection: Surgical

## 2023-03-06 ENCOUNTER — Encounter: Payer: BC Managed Care – PPO | Admitting: Orthopedic Surgery

## 2023-03-20 ENCOUNTER — Encounter: Payer: BC Managed Care – PPO | Admitting: Orthopedic Surgery

## 2023-03-29 ENCOUNTER — Encounter: Payer: Self-pay | Admitting: Diagnostic Neuroimaging

## 2023-03-29 ENCOUNTER — Ambulatory Visit (INDEPENDENT_AMBULATORY_CARE_PROVIDER_SITE_OTHER): Payer: BC Managed Care – PPO | Admitting: Diagnostic Neuroimaging

## 2023-03-29 VITALS — BP 116/65 | HR 52 | Ht 59.0 in | Wt 154.4 lb

## 2023-03-29 DIAGNOSIS — G600 Hereditary motor and sensory neuropathy: Secondary | ICD-10-CM | POA: Diagnosis not present

## 2023-03-29 NOTE — Progress Notes (Signed)
 [facility]  PATIENT: Nichole Mcneil DOB: 1980/11/26  REFERRING CLINICIAN: Persons, Ronal Dragon, [place] HISTORY FROM: patient  REASON FOR VISIT: new consult   HISTORICAL  CHIEF COMPLAINT:  Chief Complaint  Patient presents with   New Patient (Initial Visit)    Pt in 6 Pt here for tingling in hands ,feet and toes . 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  Patient has family history of CMT in maternal grandmother, mother, maternal aunt and maternal uncle.  Another maternal uncle does not have the condition.  She and her family were tested at [facility] many years ago.  Patient was having some issues with balance and walking at age 3 years old and therefore she was tested.  She was positive for CMT, but we do not have the details of the specific subtype or gene testing results.  Patient has a 34 year old son who has not been tested, but also does not have any symptoms of CMT at this time.  Patient has had weakness, numbness, high arches, hammertoes, foot pain, numbness and tingling, burning sensation in the toes and feet.  Lately she is starting to feel some symptoms in her fingertips.  She has tried gabapentin  in the past but this caused side effects.   REVIEW OF SYSTEMS: Full 14 system review of systems performed and negative with exception of: as per HPI.  ALLERGIES: Allergies  Allergen Reactions   Macrolides And Ketolides Hives   Shellfish Allergy Hives and Swelling   Sulfa Antibiotics Hives    Other reaction(s): Unknown   Other Itching and Rash    Dermabond    HOME MEDICATIONS: Outpatient Medications Prior to Visit  Medication Sig Dispense Refill   Ascorbic Acid (VITAMIN C) 1000 MG tablet Take 1,000 mg by mouth daily.     BIOTIN PO Take 2 capsules by mouth daily.     conjugated estrogens (PREMARIN) vaginal cream Place 1 applicator vaginally daily.      Magnesium Carbonate POWD Take 1 Scoop by mouth daily.     meloxicam  (MOBIC ) 15 MG tablet Take 1 tablet (15 mg total) by mouth daily. 90 tablet 3   progesterone  (PROMETRIUM ) 100 MG capsule Take 100 mg by mouth at bedtime.     propranolol  (INDERAL ) 40 MG tablet Take 1 tablet (40 mg total) by mouth daily in the afternoon. 90 tablet 3   VITAMIN D PO Take 4,000 Int'l Units/L by mouth daily.     Carboxymethylcellulose Sodium (THERATEARS OP) Place 1 drop into both eyes 2 (two) times daily.     diphenhydrAMINE (BENADRYL) 25 MG tablet Take 25 mg by mouth at bedtime.     nystatin -triamcinolone  ointment (MYCOLOG) Apply 1 application topically 4 (four) times daily. 30 g 0   No facility-administered medications prior to visit.    PAST MEDICAL HISTORY: Past Medical History:  Diagnosis Date   Charcot Earnie Tooth muscular atrophy    Migraines    has gotten better since hysterectomy   Muscular dystrophy (HCC)    at age 82    Muscular dystrophy (HCC)     PAST SURGICAL HISTORY: Past Surgical History:  Procedure Laterality Date   ABDOMINAL HYSTERECTOMY     INCISIONAL HERNIA REPAIR N/A 11/27/2020   Procedure: HERNIA REPAIR INCISIONAL;  Surgeon: Lane Shope, MD;  Location: [facility] ORS;  Service: General;  Laterality: N/A;   knee surgery     TUBAL LIGATION  FAMILY HISTORY: Family History  Problem Relation Age of Onset   Muscular dystrophy Mother    Hyperlipidemia Mother    Endometriosis Mother    Charcot-Marie-Tooth disease Mother    Neuropathy Mother    Migraines Father    Nephrolithiasis Father    Hypertension Father    Arthritis Brother    Aneurysm Maternal Uncle    Charcot-Marie-Tooth disease Maternal Uncle    Neuropathy Maternal Uncle    Stroke Maternal Grandmother    Charcot-Marie-Tooth disease Maternal Grandmother    Neuropathy Maternal Grandmother    Aneurysm Maternal Grandfather    Migraines Paternal Grandmother    Cancer Paternal Grandfather        lung    SOCIAL  HISTORY: Social History   Socioeconomic History   Marital status: Married    Spouse name: Not on file   Number of children: Not on file   Years of education: Not on file   Highest education level: Not on file  Occupational History   Not on file  Tobacco Use   Smoking status: Never    Passive exposure: Never   Smokeless tobacco: Never  Vaping Use   Vaping status: Not on file  Substance and Sexual Activity   Alcohol use: Yes    Alcohol/week: 2.0 standard drinks of alcohol    Types: 2 Glasses of wine per week    Comment: occ   Drug use: No   Sexual activity: Yes    Birth control/protection: Surgical  Other Topics Concern   Not on file  Social History Narrative   Lives with hubby and son   Pt works    Social Drivers of Corporate Investment Banker Strain: Not on file  Food Insecurity: Not on file  Transportation Needs: Not on file  Physical Activity: Not on file  Stress: Not on file  Social Connections: Unknown (01/12/2022)   Received from [employer]   Social Network    Social Network: Not on file  Intimate Partner Violence: Unknown (01/12/2022)   Received from [facility]   HITS    Physically Hurt: Not on file    Insult or Talk Down To: Not on file    Threaten Physical Harm: Not on file    Scream or Curse: Not on file     PHYSICAL EXAM  GENERAL EXAM/CONSTITUTIONAL: Vitals:  Vitals:   03/29/23 0847  BP: 116/65  Pulse: (!) 52  Weight: 154 lb 6.4 oz (70 kg)  Height: 4' 11 (1.499 m)   Body mass index is 31.19 kg/m. Wt Readings from Last 3 Encounters:  03/29/23 154 lb 6.4 oz (70 kg)  06/04/21 134 lb (60.8 kg)  12/10/20 125 lb 9.6 oz (57 kg)   Patient is in no distress; well developed, nourished and groomed; neck is supple  CARDIOVASCULAR: Examination of carotid arteries is normal; no carotid bruits Regular rate and rhythm, no murmurs Examination of peripheral vascular system by observation and palpation is normal  EYES: Ophthalmoscopic exam  of optic discs and posterior segments is normal; no papilledema or hemorrhages No results found.  MUSCULOSKELETAL: Gait, strength, tone, movements noted in Neurologic exam below  NEUROLOGIC: MENTAL STATUS:      No data to display         awake, alert, oriented to person, place and time recent and remote memory intact normal attention and concentration language fluent, comprehension intact, naming intact fund of knowledge appropriate  CRANIAL NERVE:  2nd - no papilledema on fundoscopic exam 2nd,  3rd, 4th, 6th - pupils equal and reactive to light, visual fields full to confrontation, extraocular muscles intact, no nystagmus 5th - facial sensation symmetric 7th - facial strength symmetric 8th - hearing intact 9th - palate elevates symmetrically, uvula midline 11th - shoulder shrug symmetric 12th - tongue protrusion midline  MOTOR:  normal bulk and tone, full strength in the BUE AND BLE; EXCEPT BILATERAL FOOT DF, PF, INV, EVER 2-3; HIGH ARCH AND HAMMER TOES; MILD MOVEMENT OF GREAT TOES (2-3/5); MINIMAL / NO MOVEMENT OF TOES #2-5  SENSORY:  normal and symmetric to light touch; EXCEPT DECR PP, TEMP AND VIBRATION AT TOES UP TO SHINS  COORDINATION:  finger-nose-finger, fine finger movements normal  REFLEXES:  deep tendon reflexes TRACE IN BUE; ABSENT AT ANKLES  GAIT/STATION:  narrow based gait; ROMBERG NEGATIVE     DIAGNOSTIC DATA (LABS, IMAGING, TESTING) - I reviewed patient records, labs, notes, testing and imaging myself where available.  Lab Results  Component Value Date   WBC 6.9 11/20/2020   HGB 13.4 11/20/2020   HCT 40.0 11/20/2020   MCV 99.5 11/20/2020   PLT 210 11/20/2020      Component Value Date/Time   NA 137 11/20/2020 1049   K 4.7 11/20/2020 1049   CL 101 11/20/2020 1049   CO2 27 11/20/2020 1049   GLUCOSE 85 11/20/2020 1049   BUN 29 (H) 11/20/2020 1049   CREATININE 0.67 11/20/2020 1049   CREATININE 0.69 07/13/2018 1622   CALCIUM 9.7 11/20/2020  1049   PROT 7.1 11/20/2020 1049   ALBUMIN 4.3 11/20/2020 1049   AST 25 11/20/2020 1049   ALT 22 11/20/2020 1049   ALKPHOS 62 11/20/2020 1049   BILITOT 0.8 11/20/2020 1049   GFRNONAA >60 11/20/2020 1049   No results found for: CHOL, HDL, LDLCALC, LDLDIRECT, TRIG, CHOLHDL No results found for: YHAJ8R Lab Results  Component Value Date   VITAMINB12 364 11/04/2013   Lab Results  Component Value Date   TSH 0.39 11/04/2013       ASSESSMENT AND PLAN  43 y.o. year old female here with:   Dx:  1. 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  I spent 36 minutes of face-to-face and non-face-to-face time with patient.  This included previsit chart review, lab review, study review, order entry, electronic health record documentation, patient education.    EDUARD FABIENE HANLON, MD 03/29/2023, 9:53 AM Certified in Neurology, Neurophysiology and Neuroimaging  [facility] [address] [place], [place] [address] [phone]

## 2023-03-29 NOTE — Patient Instructions (Signed)
 HEREDITARY NEUROPATHY (likely autosomal dominant; prior CMT diagnosis made at age 43 years old; specific type not known) - continue supportive care (foot care, hygiene, balance / fall precautions)

## 2023-12-25 ENCOUNTER — Encounter: Payer: Self-pay | Admitting: Radiology
